# Patient Record
Sex: Male | Born: 1980 | Race: White | Hispanic: No | Marital: Single | Smoking: Current every day smoker
Health system: Southern US, Community
[De-identification: ages and names within clinical notes are randomized; demographics above are authoritative.]

## PROBLEM LIST (undated history)

## (undated) DIAGNOSIS — F32A Depression, unspecified: Secondary | ICD-10-CM

## (undated) DIAGNOSIS — F419 Anxiety disorder, unspecified: Secondary | ICD-10-CM

## (undated) DIAGNOSIS — K759 Inflammatory liver disease, unspecified: Secondary | ICD-10-CM

## (undated) DIAGNOSIS — K219 Gastro-esophageal reflux disease without esophagitis: Secondary | ICD-10-CM

## (undated) DIAGNOSIS — F329 Major depressive disorder, single episode, unspecified: Secondary | ICD-10-CM

## (undated) DIAGNOSIS — F1010 Alcohol abuse, uncomplicated: Secondary | ICD-10-CM

## (undated) DIAGNOSIS — W3400XA Accidental discharge from unspecified firearms or gun, initial encounter: Secondary | ICD-10-CM

## (undated) HISTORY — DX: Anxiety disorder, unspecified: F41.9

## (undated) HISTORY — DX: Inflammatory liver disease, unspecified: K75.9

## (undated) HISTORY — DX: Major depressive disorder, single episode, unspecified: F32.9

## (undated) HISTORY — DX: Depression, unspecified: F32.A

---

## 2001-04-17 ENCOUNTER — Emergency Department (HOSPITAL_COMMUNITY): Admission: EM | Admit: 2001-04-17 | Discharge: 2001-04-17 | Payer: Self-pay | Admitting: *Deleted

## 2002-09-03 ENCOUNTER — Emergency Department (HOSPITAL_COMMUNITY): Admission: EM | Admit: 2002-09-03 | Discharge: 2002-09-03 | Payer: Self-pay | Admitting: Emergency Medicine

## 2002-09-03 ENCOUNTER — Encounter: Payer: Self-pay | Admitting: Emergency Medicine

## 2003-06-25 ENCOUNTER — Emergency Department (HOSPITAL_COMMUNITY): Admission: EM | Admit: 2003-06-25 | Discharge: 2003-06-26 | Payer: Self-pay | Admitting: Emergency Medicine

## 2003-06-26 ENCOUNTER — Encounter: Payer: Self-pay | Admitting: Emergency Medicine

## 2003-06-26 ENCOUNTER — Ambulatory Visit (HOSPITAL_COMMUNITY): Admission: RE | Admit: 2003-06-26 | Discharge: 2003-06-26 | Payer: Self-pay | Admitting: Emergency Medicine

## 2004-11-15 ENCOUNTER — Emergency Department (HOSPITAL_COMMUNITY): Admission: EM | Admit: 2004-11-15 | Discharge: 2004-11-16 | Payer: Self-pay | Admitting: *Deleted

## 2018-04-29 ENCOUNTER — Inpatient Hospital Stay (HOSPITAL_COMMUNITY)
Admission: EM | Admit: 2018-04-29 | Discharge: 2018-05-04 | DRG: 432 | Discharge status: Against Medical Advice | Payer: Self-pay | Attending: Internal Medicine | Admitting: Internal Medicine

## 2018-04-29 DIAGNOSIS — N400 Enlarged prostate without lower urinary tract symptoms: Secondary | ICD-10-CM | POA: Diagnosis present

## 2018-04-29 DIAGNOSIS — K760 Fatty (change of) liver, not elsewhere classified: Secondary | ICD-10-CM | POA: Diagnosis present

## 2018-04-29 DIAGNOSIS — K921 Melena: Secondary | ICD-10-CM | POA: Diagnosis not present

## 2018-04-29 DIAGNOSIS — N50811 Right testicular pain: Secondary | ICD-10-CM

## 2018-04-29 DIAGNOSIS — K625 Hemorrhage of anus and rectum: Secondary | ICD-10-CM

## 2018-04-29 DIAGNOSIS — Z720 Tobacco use: Secondary | ICD-10-CM

## 2018-04-29 DIAGNOSIS — F10230 Alcohol dependence with withdrawal, uncomplicated: Secondary | ICD-10-CM

## 2018-04-29 DIAGNOSIS — Z823 Family history of stroke: Secondary | ICD-10-CM

## 2018-04-29 DIAGNOSIS — F17210 Nicotine dependence, cigarettes, uncomplicated: Secondary | ICD-10-CM | POA: Diagnosis present

## 2018-04-29 DIAGNOSIS — K7010 Alcoholic hepatitis without ascites: Principal | ICD-10-CM | POA: Diagnosis present

## 2018-04-29 DIAGNOSIS — K2920 Alcoholic gastritis without bleeding: Secondary | ICD-10-CM | POA: Diagnosis present

## 2018-04-29 DIAGNOSIS — E43 Unspecified severe protein-calorie malnutrition: Secondary | ICD-10-CM | POA: Diagnosis present

## 2018-04-29 DIAGNOSIS — F1020 Alcohol dependence, uncomplicated: Secondary | ICD-10-CM | POA: Diagnosis present

## 2018-04-29 DIAGNOSIS — Z681 Body mass index (BMI) 19 or less, adult: Secondary | ICD-10-CM

## 2018-04-29 DIAGNOSIS — N433 Hydrocele, unspecified: Secondary | ICD-10-CM | POA: Diagnosis present

## 2018-04-29 DIAGNOSIS — F10239 Alcohol dependence with withdrawal, unspecified: Secondary | ICD-10-CM | POA: Diagnosis present

## 2018-04-29 HISTORY — DX: Accidental discharge from unspecified firearms or gun, initial encounter: W34.00XA

## 2018-04-29 HISTORY — DX: Alcohol abuse, uncomplicated: F10.10

## 2018-04-29 NOTE — ED Triage Notes (Addendum)
Pt with enlarged right testicle x 2 weeks. Pt also c/o pain in lower abdomen and pt thinks he has a hernia (helped leift his dad one day). Pt also c/o bloating. States his stomach always feels full and that he can only take small sips of water at a time before he can drink anymore. Sclera noticeably jaundiced. Pt also c/o chest pain.

## 2018-04-30 ENCOUNTER — Emergency Department (HOSPITAL_COMMUNITY): Payer: Self-pay

## 2018-04-30 ENCOUNTER — Encounter (HOSPITAL_COMMUNITY): Payer: Self-pay | Admitting: Emergency Medicine

## 2018-04-30 ENCOUNTER — Inpatient Hospital Stay (HOSPITAL_COMMUNITY): Payer: Self-pay

## 2018-04-30 ENCOUNTER — Other Ambulatory Visit: Payer: Self-pay

## 2018-04-30 DIAGNOSIS — K7010 Alcoholic hepatitis without ascites: Secondary | ICD-10-CM | POA: Diagnosis present

## 2018-04-30 DIAGNOSIS — F10232 Alcohol dependence with withdrawal with perceptual disturbance: Secondary | ICD-10-CM

## 2018-04-30 DIAGNOSIS — F10239 Alcohol dependence with withdrawal, unspecified: Secondary | ICD-10-CM

## 2018-04-30 DIAGNOSIS — N50811 Right testicular pain: Secondary | ICD-10-CM | POA: Diagnosis present

## 2018-04-30 DIAGNOSIS — F1020 Alcohol dependence, uncomplicated: Secondary | ICD-10-CM | POA: Diagnosis present

## 2018-04-30 LAB — BASIC METABOLIC PANEL
ANION GAP: 12 (ref 5–15)
BUN: <5 mg/dL — ABNORMAL LOW (ref 6–20)
CO2: 27 mmol/L (ref 22–32)
Calcium: 8.8 mg/dL — ABNORMAL LOW (ref 8.9–10.3)
Chloride: 95 mmol/L — ABNORMAL LOW (ref 98–111)
Creatinine, Ser: 0.37 mg/dL — ABNORMAL LOW (ref 0.61–1.24)
GFR calc Af Amer: >60 mL/min (ref >60)
GFR calc non Af Amer: >60 mL/min (ref >60)
GLUCOSE: 100 mg/dL — AB (ref 70–99)
POTASSIUM: 3.7 mmol/L (ref 3.5–5.1)
Sodium: 134 mmol/L — ABNORMAL LOW (ref 135–145)

## 2018-04-30 LAB — HEPATIC FUNCTION PANEL
ALBUMIN: 3.2 g/dL — AB (ref 3.5–5.0)
ALT: 57 U/L — AB (ref 0–44)
AST: 244 U/L — AB (ref 15–41)
Alkaline Phosphatase: 365 U/L — ABNORMAL HIGH (ref 38–126)
BILIRUBIN DIRECT: 2.5 mg/dL — AB (ref 0.0–0.2)
Indirect Bilirubin: 2.5 mg/dL — ABNORMAL HIGH (ref 0.3–0.9)
TOTAL PROTEIN: 7.1 g/dL (ref 6.5–8.1)
Total Bilirubin: 5 mg/dL — ABNORMAL HIGH (ref 0.3–1.2)

## 2018-04-30 LAB — CBC WITH DIFFERENTIAL/PLATELET
BASOS PCT: 1 %
Basophils Absolute: 0.1 10*3/uL (ref 0.0–0.1)
EOS ABS: 0.1 10*3/uL (ref 0.0–0.7)
Eosinophils Relative: 1 %
HCT: 37.9 % — ABNORMAL LOW (ref 39.0–52.0)
Hemoglobin: 13.1 g/dL (ref 13.0–17.0)
LYMPHS ABS: 1.2 10*3/uL (ref 0.7–4.0)
Lymphocytes Relative: 12 %
MCH: 35.5 pg — ABNORMAL HIGH (ref 26.0–34.0)
MCHC: 34.6 g/dL (ref 30.0–36.0)
MCV: 102.7 fL — AB (ref 78.0–100.0)
MONOS PCT: 12 %
Monocytes Absolute: 1.2 10*3/uL — ABNORMAL HIGH (ref 0.1–1.0)
NEUTROS ABS: 7.3 10*3/uL (ref 1.7–7.7)
NEUTROS PCT: 74 %
PLATELETS: 170 10*3/uL (ref 150–400)
RBC: 3.69 MIL/uL — ABNORMAL LOW (ref 4.22–5.81)
RDW: 13 % (ref 11.5–15.5)
WBC: 9.8 10*3/uL (ref 4.0–10.5)

## 2018-04-30 LAB — LACTIC ACID, PLASMA
Lactic Acid, Venous: 1.3 mmol/L (ref 0.5–1.9)
Lactic Acid, Venous: 1.1 mmol/L (ref 0.5–1.9)

## 2018-04-30 LAB — I-STAT CG4 LACTIC ACID, ED: LACTIC ACID, VENOUS: 2.72 mmol/L — AB (ref 0.5–1.9)

## 2018-04-30 LAB — URINALYSIS, ROUTINE W REFLEX MICROSCOPIC
BILIRUBIN URINE: NEGATIVE
GLUCOSE, UA: NEGATIVE mg/dL
HGB URINE DIPSTICK: NEGATIVE
Ketones, ur: NEGATIVE mg/dL
Leukocytes, UA: NEGATIVE
Nitrite: NEGATIVE
PROTEIN: NEGATIVE mg/dL
Specific Gravity, Urine: 1.002 — ABNORMAL LOW (ref 1.005–1.030)
pH: 8 (ref 5.0–8.0)

## 2018-04-30 LAB — PROTIME-INR
INR: 1.18
Prothrombin Time: 14.9 seconds (ref 11.4–15.2)

## 2018-04-30 LAB — ETHANOL: Alcohol, Ethyl (B): 20 mg/dL — ABNORMAL HIGH (ref <10)

## 2018-04-30 LAB — LIPASE, BLOOD: LIPASE: 37 U/L (ref 11–51)

## 2018-04-30 MED ORDER — THIAMINE HCL 100 MG/ML IJ SOLN: 100.0000 mg | Freq: Every day | INTRAMUSCULAR | Status: DC

## 2018-04-30 MED ORDER — LORAZEPAM 2 MG/ML IJ SOLN
0.0000 mg | Freq: Four times a day (QID) | INTRAMUSCULAR | Status: DC
  Administered 2018-04-30: 2 mg via INTRAVENOUS
  Filled 2018-04-30 (×2): qty 1

## 2018-04-30 MED ORDER — ADULT MULTIVITAMIN W/MINERALS CH: 1.0000 | ORAL_TABLET | Freq: Every day | ORAL | Status: DC

## 2018-04-30 MED ORDER — CHLORDIAZEPOXIDE HCL 5 MG PO CAPS
20.0000 mg | ORAL_CAPSULE | Freq: Three times a day (TID) | ORAL | Status: DC
  Administered 2018-04-30 – 2018-05-02 (×7): 20 mg via ORAL
  Filled 2018-04-30 (×6): qty 4

## 2018-04-30 MED ORDER — ADULT MULTIVITAMIN W/MINERALS CH
1.0000 | ORAL_TABLET | Freq: Every day | ORAL | Status: DC
  Administered 2018-05-01 – 2018-05-03 (×3): 1 via ORAL
  Filled 2018-04-30 (×3): qty 1

## 2018-04-30 MED ORDER — LORAZEPAM 2 MG/ML IJ SOLN
2.0000 mg | Freq: Once | INTRAMUSCULAR | Status: AC
  Administered 2018-04-30: 2 mg via INTRAVENOUS
  Filled 2018-04-30: qty 1

## 2018-04-30 MED ORDER — IOPAMIDOL (ISOVUE-300) INJECTION 61%
100.0000 mL | Freq: Once | INTRAVENOUS | Status: AC | PRN
  Administered 2018-04-30: 100 mL via INTRAVENOUS

## 2018-04-30 MED ORDER — FOLIC ACID 1 MG PO TABS: 1.0000 mg | ORAL_TABLET | Freq: Every day | ORAL | Status: DC

## 2018-04-30 MED ORDER — SUCRALFATE 1 GM/10ML PO SUSP
1.0000 g | Freq: Three times a day (TID) | ORAL | Status: DC
  Administered 2018-05-01 – 2018-05-04 (×10): 1 g via ORAL
  Filled 2018-04-30 (×10): qty 10

## 2018-04-30 MED ORDER — SODIUM CHLORIDE 0.9 % IV BOLUS
1000.0000 mL | Freq: Once | INTRAVENOUS | Status: AC
  Administered 2018-04-30: 1000 mL via INTRAVENOUS

## 2018-04-30 MED ORDER — LORAZEPAM 2 MG/ML IJ SOLN
0.0000 mg | Freq: Four times a day (QID) | INTRAMUSCULAR | Status: AC
  Administered 2018-05-01 – 2018-05-02 (×5): 2 mg via INTRAVENOUS
  Administered 2018-05-02: 1 mg via INTRAVENOUS
  Filled 2018-04-30: qty 2
  Filled 2018-04-30 (×5): qty 1

## 2018-04-30 MED ORDER — ONDANSETRON HCL 4 MG/2ML IJ SOLN: 4.0000 mg | Freq: Four times a day (QID) | INTRAMUSCULAR | Status: DC | PRN

## 2018-04-30 MED ORDER — LORAZEPAM 2 MG/ML IJ SOLN
1.0000 mg | Freq: Four times a day (QID) | INTRAMUSCULAR | Status: DC | PRN
  Administered 2018-04-30 (×2): 1 mg via INTRAVENOUS
  Filled 2018-04-30: qty 1

## 2018-04-30 MED ORDER — ENOXAPARIN SODIUM 40 MG/0.4ML ~~LOC~~ SOLN
40.0000 mg | SUBCUTANEOUS | Status: DC
  Administered 2018-04-30 – 2018-05-03 (×4): 40 mg via SUBCUTANEOUS
  Filled 2018-04-30 (×4): qty 0.4

## 2018-04-30 MED ORDER — FENTANYL CITRATE (PF) 100 MCG/2ML IJ SOLN
25.0000 ug | INTRAMUSCULAR | Status: DC | PRN
  Administered 2018-04-30 – 2018-05-03 (×19): 50 ug via INTRAVENOUS
  Filled 2018-04-30 (×19): qty 2

## 2018-04-30 MED ORDER — FOLIC ACID 5 MG/ML IJ SOLN
INTRAMUSCULAR | Status: AC
  Filled 2018-04-30: qty 0.2

## 2018-04-30 MED ORDER — THIAMINE HCL 100 MG/ML IJ SOLN
Freq: Once | INTRAVENOUS | Status: AC
  Administered 2018-04-30: 04:00:00 via INTRAVENOUS
  Filled 2018-04-30: qty 1000

## 2018-04-30 MED ORDER — LORAZEPAM 1 MG PO TABS
1.0000 mg | ORAL_TABLET | Freq: Four times a day (QID) | ORAL | Status: AC | PRN
  Administered 2018-05-03: 1 mg via ORAL
  Filled 2018-04-30: qty 1

## 2018-04-30 MED ORDER — LORAZEPAM 2 MG/ML IJ SOLN: 0.0000 mg | Freq: Two times a day (BID) | INTRAMUSCULAR | Status: DC

## 2018-04-30 MED ORDER — THIAMINE HCL 100 MG/ML IJ SOLN
INTRAMUSCULAR | Status: AC
  Filled 2018-04-30: qty 2

## 2018-04-30 MED ORDER — VITAMIN B-1 100 MG PO TABS: 100.0000 mg | ORAL_TABLET | Freq: Every day | ORAL | Status: DC

## 2018-04-30 MED ORDER — LORAZEPAM 1 MG PO TABS: 1.0000 mg | ORAL_TABLET | Freq: Four times a day (QID) | ORAL | Status: DC | PRN

## 2018-04-30 MED ORDER — PANTOPRAZOLE SODIUM 40 MG PO TBEC
40.0000 mg | DELAYED_RELEASE_TABLET | Freq: Every day | ORAL | Status: DC
  Administered 2018-05-01 – 2018-05-03 (×3): 40 mg via ORAL
  Filled 2018-04-30 (×3): qty 1

## 2018-04-30 MED ORDER — ONDANSETRON HCL 4 MG PO TABS: 4.0000 mg | ORAL_TABLET | Freq: Four times a day (QID) | ORAL | Status: DC | PRN

## 2018-04-30 MED ORDER — LORAZEPAM 2 MG/ML IJ SOLN
0.0000 mg | Freq: Two times a day (BID) | INTRAMUSCULAR | Status: AC
  Administered 2018-05-02 – 2018-05-03 (×3): 1 mg via INTRAVENOUS
  Administered 2018-05-04: 2 mg via INTRAVENOUS
  Filled 2018-04-30 (×4): qty 1

## 2018-04-30 MED ORDER — M.V.I. ADULT IV INJ
INJECTION | INTRAVENOUS | Status: AC
Start: 2018-04-30 — End: ?
  Filled 2018-04-30: qty 10

## 2018-04-30 MED ORDER — FOLIC ACID 1 MG PO TABS
1.0000 mg | ORAL_TABLET | Freq: Every day | ORAL | Status: DC
  Administered 2018-05-01 – 2018-05-03 (×3): 1 mg via ORAL
  Filled 2018-04-30 (×3): qty 1

## 2018-04-30 MED ORDER — FAMOTIDINE IN NACL 20-0.9 MG/50ML-% IV SOLN
20.0000 mg | Freq: Two times a day (BID) | INTRAVENOUS | Status: DC
  Administered 2018-04-30 (×2): 20 mg via INTRAVENOUS
  Filled 2018-04-30 (×2): qty 50

## 2018-04-30 MED ORDER — LORAZEPAM 2 MG/ML IJ SOLN
1.0000 mg | Freq: Four times a day (QID) | INTRAMUSCULAR | Status: AC | PRN
  Administered 2018-04-30 – 2018-05-02 (×4): 1 mg via INTRAVENOUS
  Filled 2018-04-30 (×4): qty 1

## 2018-04-30 MED ORDER — SODIUM CHLORIDE 0.9 % IV SOLN
INTRAVENOUS | Status: DC
Start: 2018-04-30 — End: 2018-05-04
  Administered 2018-04-30 – 2018-05-03 (×5): via INTRAVENOUS

## 2018-04-30 MED ORDER — NICOTINE 21 MG/24HR TD PT24
21.0000 mg | MEDICATED_PATCH | Freq: Every day | TRANSDERMAL | Status: DC
  Administered 2018-04-30 – 2018-05-03 (×4): 21 mg via TRANSDERMAL
  Filled 2018-04-30 (×4): qty 1

## 2018-04-30 MED ORDER — VITAMIN B-1 100 MG PO TABS
100.0000 mg | ORAL_TABLET | Freq: Every day | ORAL | Status: DC
  Administered 2018-05-01 – 2018-05-03 (×3): 100 mg via ORAL
  Filled 2018-04-30 (×3): qty 1

## 2018-04-30 MED ORDER — CHLORDIAZEPOXIDE HCL 5 MG PO CAPS
20.0000 mg | ORAL_CAPSULE | Freq: Three times a day (TID) | ORAL | Status: DC
  Filled 2018-04-30: qty 4

## 2018-04-30 MED ORDER — ENSURE ENLIVE PO LIQD
237.0000 mL | Freq: Two times a day (BID) | ORAL | Status: DC
  Administered 2018-04-30 – 2018-05-02 (×6): 237 mL via ORAL

## 2018-04-30 NOTE — Progress Notes (Signed)
Patient seen and examined; admitted after midnight due to alcoholic hepatitis and withdrawal. Patient's VSS, in no major distress. Expressed some visual hallucinations and would like to use librium instead of scheduled ativan. Please refer to H&P written by Dr. Antionette Charpyd for further info/details on admission.   Plan: -continue CIWA protocol (PRN ativan) -will start librium -continue electrolytes repletion, IVF's and supportive care. -will use PPI and carafate; PRN antiemetics and advance diet as tolerated. -follow LFT's trend   Vassie Lollarlos Nahzir Pohle MD [phone]
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
## 2018-04-30 NOTE — Progress Notes (Signed)
Safety sitter in place to protect patient from elopement. Pt is sleeping Respirations 18 even unlabored
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
## 2018-04-30 NOTE — Clinical Social Work Note (Signed)
LCSW is aware of referral. Patient is currently experiencing alcohol withdrawal. Patient will be assessed for SA needs when he is medically stable.     Fritzi Scripter, Juleen ChinaHeather D, LCSW

## 2018-04-30 NOTE — ED Notes (Signed)
Called AC for med  

## 2018-04-30 NOTE — ED Notes (Signed)
Date and time results received: 04/30/18 12:43 AM  (use smartphrase ".now" to insert current time)  Test:  Lactic Acid, Venous    Component Value Date/Time   LATICACIDVEN 2.72 [facility]) 04/30/2018 0032     Name of Provider Notified: Delo  Orders Received? Or Actions Taken?: See orders

## 2018-05-01 DIAGNOSIS — K219 Gastro-esophageal reflux disease without esophagitis: Secondary | ICD-10-CM

## 2018-05-01 LAB — COMPREHENSIVE METABOLIC PANEL WITH GFR
ALT: 44 U/L (ref 0–44)
AST: 163 U/L — ABNORMAL HIGH (ref 15–41)
Albumin: 2.8 g/dL — ABNORMAL LOW (ref 3.5–5.0)
Alkaline Phosphatase: 296 U/L — ABNORMAL HIGH (ref 38–126)
Anion gap: 10 (ref 5–15)
BUN: <5 mg/dL — ABNORMAL LOW (ref 6–20)
CO2: 26 mmol/L (ref 22–32)
Calcium: 8.7 mg/dL — ABNORMAL LOW (ref 8.9–10.3)
Chloride: 99 mmol/L (ref 98–111)
Creatinine, Ser: 0.33 mg/dL — ABNORMAL LOW (ref 0.61–1.24)
GFR calc Af Amer: >60 mL/min
GFR calc non Af Amer: >60 mL/min
Glucose, Bld: 90 mg/dL (ref 70–99)
Potassium: 3.5 mmol/L (ref 3.5–5.1)
Sodium: 135 mmol/L (ref 135–145)
Total Bilirubin: 5 mg/dL — ABNORMAL HIGH (ref 0.3–1.2)
Total Protein: 6.1 g/dL — ABNORMAL LOW (ref 6.5–8.1)

## 2018-05-01 LAB — HEPATITIS PANEL, ACUTE
HCV Ab: <0.1 s/co ratio (ref 0.0–0.9)
Hep A IgM: NEGATIVE
Hep B C IgM: NEGATIVE
Hepatitis B Surface Ag: NEGATIVE

## 2018-05-01 LAB — HIV ANTIBODY (ROUTINE TESTING W REFLEX): HIV Screen 4th Generation wRfx: NONREACTIVE

## 2018-05-01 NOTE — Progress Notes (Signed)
PROGRESS NOTE    Tom Herman  EAV:[id number] DOB: Feb 10, 1981 DOA: 04/29/2018 PCP: Tom Herman, No Pcp Per     Brief Narrative:  37 y.o. male who denies any significant past medical history, now presenting to the emergency department for evaluation of abdominal pain, nausea with nonbloody vomiting, anorexia, and pain in the right testicle.  Tom Herman reports a long history of excessive alcohol use and states that he has been drinking significantly more over the past 10 months, during which time he has begun drinking hard liquor from the time he wakes up.  For the past few months, he has had progressive loss of appetite with frequent nausea and nonbloody vomiting.  He has had little if any appetite for the past couple weeks and has had epigastric and right upper quadrant abdominal pain.  Others have noted that his eyes have appeared yellow.  He denies history of IV drug use.  Reports that he wants to quit drinking and has attempted to cut back, but these attempts have been complicated by withdrawal symptoms.   Assessment & Plan: 1-Alcoholic hepatitis and alcohol withdrawal -Tom Herman discrimination function score 18, no steroids indicated -will continue CIWA protocol and librium  -will continue thiamine and folic acid  -continue supportive care and follow clinical response -extensive cessation counseling provided; CSW asked to provide outpatient resources. -follow LFT's trend (currently trending down), but bilirubin still up and having ictericia, will check RUQ US -follow electrolytes and replete them as needed   2-gastritis  -most likely alcohol induced -will continue PPI and carafate   3-Right testicular pain -no abnormalities seen on scrotum US -Tom Herman denies pain during my evaluation   4-tobacco abuse -nicotine patch ordered -I have discussed tobacco cessation with the Tom Herman.  I have counseled the Tom Herman regarding the negative impacts of continued tobacco use including but not  limited to lung cancer, COPD, and cardiovascular disease.  I have discussed alternatives to tobacco and modalities that may help facilitate tobacco cessation including but not limited to biofeedback, hypnosis, and medications.  Total time spent with tobacco counseling was 5 minutes.   DVT prophylaxis: Lovenox Code Status: Full code Family Communication: No family at bedside (girlfriend was updated on 04/30/2018). Disposition Plan: Continue current medication to assist with alcohol withdrawal, continue advancing diet as tolerated, follow liver function test and check RUQ US.  Consultants:   None  Procedures:   See below for x-ray reports.  Antimicrobials:  Anti-infectives (From admission, onward)   None      Subjective: Afebrile, no chest pain, no shortness of breath.  Tom Herman still reporting decreased appetite and complaining of burning midepigastric area and right upper quadrant.  Significant events associated with his alcohol withdrawal overnight (including hallucination, anxiety and almost leaving AMA).  Objective: Vitals:   04/30/18 1413 04/30/18 2200 05/01/18 0408 05/01/18 1024  BP: (!) 162/112 (!) 141/103 (!) 138/97 (!) 131/95  Pulse: (!) 174 100 88 91  Resp: 18  16 20   Temp: 99.4 F (37.4 C) 98.1 F (36.7 C) 98.6 F (37 C)   TempSrc: Oral Oral Oral   SpO2: 100% 100% 98% 98%  Weight:      Height:        Intake/Output Summary (Last 24 hours) at 05/01/2018 1653 Last data filed at 05/01/2018 0600 Gross per 24 hour  Intake 585 ml  Output -  Net 585 ml   Filed Weights   04/30/18 0000 04/30/18 0334  Weight: 54.4 kg 58.9 kg    Examination: General exam:  Alert, awake, oriented x 2; slightly anxious and tremulous on exam.  Denies chest pain, no shortness of breath. Positive icterus (even slightly improved). Still reporting epigastric and right upper quadrant discomfort. Respiratory system: Clear to auscultation. Respiratory effort normal. Cardiovascular system:  Tachycardic, no rubs, no gallops, no murmurs, no JVD.   Gastrointestinal system: Abdomen is nondistended, soft without rebound's.  Tom Herman tender to palpation in his right upper quadrant midepigastric area.  Positive bowel sounds.   Central nervous system: Cranial nerve grossly intact, no focal motor deficits on exam.  Able to follow commands without difficulties and answer questions. Extremities: No C/C/E, +pedal pulses Skin: No rashes, lesions or ulcers Psychiatry: Judgement and insight appear impaired due to ongoing alcohol withdrawal.   Data Reviewed: I have personally reviewed following labs and imaging studies  CBC: Recent Labs  Lab 04/30/18 0024  WBC 9.8  NEUTROABS 7.3  HGB 13.1  HCT 37.9*  MCV 102.7*  PLT 170   Basic Metabolic Panel: Recent Labs  Lab 04/30/18 0024 05/01/18 0501  NA 134* 135  K 3.7 3.5  CL 95* 99  CO2 27 26  GLUCOSE 100* 90  BUN <5* <5*  CREATININE 0.37* 0.33*  CALCIUM 8.8* 8.7*   GFR: Estimated Creatinine Clearance: 107.4 mL/min (A) (by C-G formula based on SCr of 0.33 mg/dL (L)).   Liver Function Tests: Recent Labs  Lab 04/30/18 0024 05/01/18 0501  AST 244* 163*  ALT 57* 44  ALKPHOS 365* 296*  BILITOT 5.0* 5.0*  PROT 7.1 6.1*  ALBUMIN 3.2* 2.8*   Recent Labs  Lab 04/30/18 0024  LIPASE 37   Coagulation Profile: Recent Labs  Lab 04/30/18 0024  INR 1.18   Urine analysis:    Component Value Date/Time   COLORURINE YELLOW 04/30/2018 0033   APPEARANCEUR CLEAR 04/30/2018 0033   LABSPEC 1.002 (L) 04/30/2018 0033   PHURINE 8.0 04/30/2018 0033   GLUCOSEU NEGATIVE 04/30/2018 0033   HGBUR NEGATIVE 04/30/2018 0033   BILIRUBINUR NEGATIVE 04/30/2018 0033   KETONESUR NEGATIVE 04/30/2018 0033   PROTEINUR NEGATIVE 04/30/2018 0033   NITRITE NEGATIVE 04/30/2018 0033   LEUKOCYTESUR NEGATIVE 04/30/2018 0033   Radiology Studies: [place] Scrotum  Result Date: 04/30/2018 CLINICAL DATA:  37 year old male with RIGHT testicular pain for 2 weeks.  EXAM: SCROTAL ULTRASOUND DOPPLER ULTRASOUND OF THE TESTICLES TECHNIQUE: Complete ultrasound examination of the testicles, epididymis, and other scrotal structures was performed. Color and spectral Doppler ultrasound were also utilized to evaluate blood flow to the testicles. COMPARISON:  None. FINDINGS: Right testicle Measurements: 3.8 x 2.1 x 2.4 cm. No mass or microlithiasis visualized. Left testicle Measurements: 4.2 x 1.8 x 2.8 cm. No mass or microlithiasis visualized. Right epididymis:  Normal in size and appearance. Left epididymis:  Normal in size and appearance. Hydrocele:  Very small bilateral hydroceles noted. Varicocele:  None visualized. Pulsed Doppler interrogation of both testes demonstrates normal low resistance arterial and venous waveforms bilaterally. Scrotal calcifications are chronic, compatible with prior infection or inflammation. IMPRESSION: 1. Normal testicles bilaterally. No evidence of testicular torsion or mass. 2. Very small bilateral hydroceles. Electronically Signed   By: Harmon PierJeffrey  Hu M.D.   On: 04/30/2018 10:28   Ct Abdomen Pelvis W Contrast  Result Date: 04/30/2018 CLINICAL DATA:  Epigastric and low pelvic pain. Enlarged right testicle. Bloating. Jaundice for 2 weeks. Right testicular pain beginning 2 weeks ago while running. EXAM: CT ABDOMEN AND PELVIS WITH CONTRAST TECHNIQUE: Multidetector CT imaging of the abdomen and pelvis was performed using the standard protocol following  bolus administration of intravenous contrast. CONTRAST:  ISOVUE-300 IOPAMIDOL (ISOVUE-300) INJECTION 61% COMPARISON:  None. FINDINGS: Lower chest: The lung bases are clear. Hepatobiliary: Prominent diffuse fatty infiltration of the liver. No focal liver lesions identified. Gallbladder is somewhat distended but there is no wall thickening, inflammatory infiltration, or stones identified. No bile duct dilatation. Pancreas: Unremarkable. No pancreatic ductal dilatation or surrounding inflammatory  changes. Spleen: Normal in size without focal abnormality. Adrenals/Urinary Tract: No adrenal gland nodules. Kidneys are symmetrical. Nephrograms are homogeneous. No hydronephrosis or hydroureter. Bladder wall is mildly thickened, possibly indicating cystitis. No filling defects or stones identified. Stomach/Bowel: Stomach is within normal limits. Appendix is not identified. No evidence of bowel wall thickening, distention, or inflammatory changes. Vascular/Lymphatic: No significant vascular findings are present. No enlarged abdominal or pelvic lymph nodes. Reproductive: Prostate gland is enlarged, measuring 4.3 cm diameter. Prostate calcifications are present. Other: No abdominal wall hernia or abnormality. No abdominopelvic ascites. Musculoskeletal: No acute or significant osseous findings. IMPRESSION: 1. Prominent diffuse fatty infiltration of the liver. No focal liver lesions identified. 2. Bladder wall thickening, possibly indicating cystitis. 3. No evidence of bowel obstruction or inflammation. 4. Enlarged prostate gland. Electronically Signed   By: Burman Nieves M.D.   On: 04/30/2018 01:42   US Pelvic Doppler (torsion R/o Or Mass Arterial Flow)  Result Date: 04/30/2018 CLINICAL DATA:  37 year old male with RIGHT testicular pain for 2 weeks. EXAM: SCROTAL ULTRASOUND DOPPLER ULTRASOUND OF THE TESTICLES TECHNIQUE: Complete ultrasound examination of the testicles, epididymis, and other scrotal structures was performed. Color and spectral Doppler ultrasound were also utilized to evaluate blood flow to the testicles. COMPARISON:  None. FINDINGS: Right testicle Measurements: 3.8 x 2.1 x 2.4 cm. No mass or microlithiasis visualized. Left testicle Measurements: 4.2 x 1.8 x 2.8 cm. No mass or microlithiasis visualized. Right epididymis:  Normal in size and appearance. Left epididymis:  Normal in size and appearance. Hydrocele:  Very small bilateral hydroceles noted. Varicocele:  None visualized. Pulsed Doppler  interrogation of both testes demonstrates normal low resistance arterial and venous waveforms bilaterally. Scrotal calcifications are chronic, compatible with prior infection or inflammation. IMPRESSION: 1. Normal testicles bilaterally. No evidence of testicular torsion or mass. 2. Very small bilateral hydroceles. Electronically Signed   By: Harmon Pier M.D.   On: 04/30/2018 10:28    Scheduled Meds: . chlordiazePOXIDE  20 mg Oral TID  . enoxaparin (LOVENOX) injection  40 mg Subcutaneous Q24H  . feeding supplement (ENSURE ENLIVE)  237 mL Oral BID BM  . folic acid  1 mg Oral Daily  . LORazepam  0-4 mg Intravenous Q6H   Followed by  . [START ON 05/02/2018] LORazepam  0-4 mg Intravenous Q12H  . multivitamin with minerals  1 tablet Oral Daily  . nicotine  21 mg Transdermal Daily  . pantoprazole  40 mg Oral Daily  . sucralfate  1 g Oral TID WC  . thiamine  100 mg Oral Daily   Or  . thiamine  100 mg Intravenous Daily   Continuous Infusions: . sodium chloride 75 mL/hr at 05/01/18 1101     LOS: 1 day    Time spent: 30 minutes.   Vassie Loll, MD [facility] Pager [phone]  If 7PM-7AM, please contact night-coverage [email or web address] Password [facility] 05/01/2018, 4:53 PM
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
## 2018-05-01 NOTE — Progress Notes (Signed)
Pt up and agitated. He states we are not helping him and that he is our "[place]". He stated he was going to leave regardless or what the doctor said. I informed him that he needed to sign AMA documentation and his IV needed to be removed. He asked me to remove it but I said he needed to call someone to come pick him up and then we can take the IV out. He called his girlfriend and she told him to stay because he was not well. She told him she would be back in the morning and hung up the phone. He became angry and threw himself on the bed. He did not end up leaving AMA.

## 2018-05-02 ENCOUNTER — Inpatient Hospital Stay (HOSPITAL_COMMUNITY): Payer: Self-pay

## 2018-05-02 DIAGNOSIS — E43 Unspecified severe protein-calorie malnutrition: Secondary | ICD-10-CM

## 2018-05-02 DIAGNOSIS — F10230 Alcohol dependence with withdrawal, uncomplicated: Secondary | ICD-10-CM

## 2018-05-02 DIAGNOSIS — Z720 Tobacco use: Secondary | ICD-10-CM

## 2018-05-02 MED ORDER — CHLORDIAZEPOXIDE HCL 5 MG PO CAPS
5.0000 mg | ORAL_CAPSULE | Freq: Three times a day (TID) | ORAL | Status: AC
  Administered 2018-05-02 – 2018-05-03 (×4): 5 mg via ORAL
  Filled 2018-05-02 (×4): qty 1

## 2018-05-02 NOTE — Progress Notes (Signed)
PROGRESS NOTE  Tom Herman [id number] DOB: 1980-10-07 DOA: 04/29/2018 PCP: Patient, No Pcp Per  Brief History:  37 y.o. male who denies any significant past medical history, now presenting to the emergency department for evaluation of abdominal pain, nausea with nonbloody vomiting, anorexia, and pain in the right testicle.  Patient reports a long history of excessive alcohol use and states that he has been drinking significantly more over the past 10 months, during which time he has begun drinking hard liquor from the time he wakes up.  For the past few months, he has had progressive loss of appetite with frequent nausea and nonbloody vomiting.  He has had little if any appetite for the past couple weeks and has had epigastric and right upper quadrant abdominal pain.  Others have noted that his eyes have appeared yellow.  He denies history of IV drug use.  Reports that he wants to quit drinking and has attempted to cut back, but these attempts have been complicated by withdrawal symptoms.   Assessment/Plan: Intractable vomiting -Secondary to alcoholic gastritis -04/30/2018 CT abdomen/pelvis--fatty liver, bowel wall thickening, enlarged prostate; no bowel wall thickening -Saline lock IV fluids as vomiting is improving -Continue PPI  Alcohol withdrawal -Decrease Librium -Continue CIWA scale  Alcoholic hepatitis -LFTs trending down -Viral hepatitis serologies negative -HIV negative  Testicular pain -Scrotal ultrasound negative for acute findings except for very small bilateral hydroceles -Improving  Tobacco abuse I have discussed tobacco cessation with the patient.  I have counseled the patient regarding the negative impacts of continued tobacco use including but not limited to lung cancer, COPD, and cardiovascular disease.  I have discussed alternatives to tobacco and modalities that may help facilitate tobacco cessation including but not limited to biofeedback,  hypnosis, and medications.  Total time spent with tobacco counseling was 4 minutes.  Severe protein calorie malnutrition -Continue supplements  Hematochezia -Consult GI -A.m. CBC    Disposition Plan:   Home in 1-2 days  Family Communication:   Family at bedside  Consultants:  GI  Code Status:  FULL  DVT Prophylaxis:  SCDs   Procedures: As Listed in Progress Note Above  Antibiotics: None    Subjective: Patient states that vomiting is improved.  He still having some visual hallucinations but it is improving.  He still having some tremors.  This is improving.  He denies any headache, chest pain, shortness breath, hematemesis, coffee-ground emesis.  He has had some hematochezia.  Abdominal pain is improving.  Objective: Vitals:   05/01/18 2058 05/02/18 0424 05/02/18 1407 05/02/18 1518  BP: (!) 136/96 (!) 141/98 (!) 131/100   Pulse: 100 (!) 101 (!) 108   Resp: 16 16 16    Temp: 98.3 F (36.8 C) 98.6 F (37 C) 98.8 F (37.1 C)   TempSrc: Oral Oral Oral   SpO2: 99% 99% 100% 98%  Weight:      Height:        Intake/Output Summary (Last 24 hours) at 05/02/2018 1746 Last data filed at 05/02/2018 1100 Gross per 24 hour  Intake 480 ml  Output -  Net 480 ml   Weight change:  Exam:   General:  Pt is alert, follows commands appropriately, not in acute distress  HEENT: No icterus, No thrush, No neck mass, [place]/AT  Cardiovascular: RRR, S1/S2, no rubs, no gallops  Respiratory: CTA bilaterally, no wheezing, no crackles, no rhonchi  Abdomen: Soft/+BS, epigastric tender, non distended, no guarding  Extremities:  No edema, No lymphangitis, No petechiae, No rashes, no synovitis   Data Reviewed: I have personally reviewed following labs and imaging studies Basic Metabolic Panel: Recent Labs  Lab 04/30/18 0024 05/01/18 0501  NA 134* 135  K 3.7 3.5  CL 95* 99  CO2 27 26  GLUCOSE 100* 90  BUN <5* <5*  CREATININE 0.37* 0.33*  CALCIUM 8.8* 8.7*   Liver Function  Tests: Recent Labs  Lab 04/30/18 0024 05/01/18 0501  AST 244* 163*  ALT 57* 44  ALKPHOS 365* 296*  BILITOT 5.0* 5.0*  PROT 7.1 6.1*  ALBUMIN 3.2* 2.8*   Recent Labs  Lab 04/30/18 0024  LIPASE 37   No results for input(s): AMMONIA in the last 168 hours. Coagulation Profile: Recent Labs  Lab 04/30/18 0024  INR 1.18   CBC: Recent Labs  Lab 04/30/18 0024  WBC 9.8  NEUTROABS 7.3  HGB 13.1  HCT 37.9*  MCV 102.7*  PLT 170   Cardiac Enzymes: No results for input(s): CKTOTAL, CKMB, CKMBINDEX, TROPONINI in the last 168 hours. BNP: Invalid input(s): POCBNP CBG: No results for input(s): GLUCAP in the last 168 hours. HbA1C: No results for input(s): HGBA1C in the last 72 hours. Urine analysis:    Component Value Date/Time   COLORURINE YELLOW 04/30/2018 0033   APPEARANCEUR CLEAR 04/30/2018 0033   LABSPEC 1.002 (L) 04/30/2018 0033   PHURINE 8.0 04/30/2018 0033   GLUCOSEU NEGATIVE 04/30/2018 0033   HGBUR NEGATIVE 04/30/2018 0033   BILIRUBINUR NEGATIVE 04/30/2018 0033   KETONESUR NEGATIVE 04/30/2018 0033   PROTEINUR NEGATIVE 04/30/2018 0033   NITRITE NEGATIVE 04/30/2018 0033   LEUKOCYTESUR NEGATIVE 04/30/2018 0033   Sepsis Labs: @LABRCNTIP (procalcitonin:4,lacticidven:4) )No results found for this or any previous visit (from the past 240 hour(s)).   Scheduled Meds: . chlordiazePOXIDE  5 mg Oral TID  . enoxaparin (LOVENOX) injection  40 mg Subcutaneous Q24H  . feeding supplement (ENSURE ENLIVE)  237 mL Oral BID BM  . folic acid  1 mg Oral Daily  . LORazepam  0-4 mg Intravenous Q12H  . multivitamin with minerals  1 tablet Oral Daily  . nicotine  21 mg Transdermal Daily  . pantoprazole  40 mg Oral Daily  . sucralfate  1 g Oral TID WC  . thiamine  100 mg Oral Daily   Or  . thiamine  100 mg Intravenous Daily   Continuous Infusions: . sodium chloride 75 mL/hr at 05/01/18 2312    Procedures/Studies: US Scrotum  Result Date: 04/30/2018 CLINICAL DATA:   37 year old male with RIGHT testicular pain for 2 weeks. EXAM: SCROTAL ULTRASOUND DOPPLER ULTRASOUND OF THE TESTICLES TECHNIQUE: Complete ultrasound examination of the testicles, epididymis, and other scrotal structures was performed. Color and spectral Doppler ultrasound were also utilized to evaluate blood flow to the testicles. COMPARISON:  None. FINDINGS: Right testicle Measurements: 3.8 x 2.1 x 2.4 cm. No mass or microlithiasis visualized. Left testicle Measurements: 4.2 x 1.8 x 2.8 cm. No mass or microlithiasis visualized. Right epididymis:  Normal in size and appearance. Left epididymis:  Normal in size and appearance. Hydrocele:  Very small bilateral hydroceles noted. Varicocele:  None visualized. Pulsed Doppler interrogation of both testes demonstrates normal low resistance arterial and venous waveforms bilaterally. Scrotal calcifications are chronic, compatible with prior infection or inflammation. IMPRESSION: 1. Normal testicles bilaterally. No evidence of testicular torsion or mass. 2. Very small bilateral hydroceles. Electronically Signed   By: Harmon Pier M.D.   On: 04/30/2018 10:28   Ct Abdomen Pelvis W Contrast  Result Date: 04/30/2018 CLINICAL DATA:  Epigastric and low pelvic pain. Enlarged right testicle. Bloating. Jaundice for 2 weeks. Right testicular pain beginning 2 weeks ago while running. EXAM: CT ABDOMEN AND PELVIS WITH CONTRAST TECHNIQUE: Multidetector CT imaging of the abdomen and pelvis was performed using the standard protocol following bolus administration of intravenous contrast. CONTRAST:  100mL ISOVUE-300 IOPAMIDOL (ISOVUE-300) INJECTION 61% COMPARISON:  None. FINDINGS: Lower chest: The lung bases are clear. Hepatobiliary: Prominent diffuse fatty infiltration of the liver. No focal liver lesions identified. Gallbladder is somewhat distended but there is no wall thickening, inflammatory infiltration, or stones identified. No bile duct dilatation. Pancreas: Unremarkable. No  pancreatic ductal dilatation or surrounding inflammatory changes. Spleen: Normal in size without focal abnormality. Adrenals/Urinary Tract: No adrenal gland nodules. Kidneys are symmetrical. Nephrograms are homogeneous. No hydronephrosis or hydroureter. Bladder wall is mildly thickened, possibly indicating cystitis. No filling defects or stones identified. Stomach/Bowel: Stomach is within normal limits. Appendix is not identified. No evidence of bowel wall thickening, distention, or inflammatory changes. Vascular/Lymphatic: No significant vascular findings are present. No enlarged abdominal or pelvic lymph nodes. Reproductive: Prostate gland is enlarged, measuring 4.3 cm diameter. Prostate calcifications are present. Other: No abdominal wall hernia or abnormality. No abdominopelvic ascites. Musculoskeletal: No acute or significant osseous findings. IMPRESSION: 1. Prominent diffuse fatty infiltration of the liver. No focal liver lesions identified. 2. Bladder wall thickening, possibly indicating cystitis. 3. No evidence of bowel obstruction or inflammation. 4. Enlarged prostate gland. Electronically Signed   By: Burman NievesWilliam  Stevens M.D.   On: 04/30/2018 01:42   [place] Pelvic Doppler (torsion R/o Or Mass Arterial Flow)  Result Date: 04/30/2018 CLINICAL DATA:  37 year old male with RIGHT testicular pain for 2 weeks. EXAM: SCROTAL ULTRASOUND DOPPLER ULTRASOUND OF THE TESTICLES TECHNIQUE: Complete ultrasound examination of the testicles, epididymis, and other scrotal structures was performed. Color and spectral Doppler ultrasound were also utilized to evaluate blood flow to the testicles. COMPARISON:  None. FINDINGS: Right testicle Measurements: 3.8 x 2.1 x 2.4 cm. No mass or microlithiasis visualized. Left testicle Measurements: 4.2 x 1.8 x 2.8 cm. No mass or microlithiasis visualized. Right epididymis:  Normal in size and appearance. Left epididymis:  Normal in size and appearance. Hydrocele:  Very small bilateral  hydroceles noted. Varicocele:  None visualized. Pulsed Doppler interrogation of both testes demonstrates normal low resistance arterial and venous waveforms bilaterally. Scrotal calcifications are chronic, compatible with prior infection or inflammation. IMPRESSION: 1. Normal testicles bilaterally. No evidence of testicular torsion or mass. 2. Very small bilateral hydroceles. Electronically Signed   By: Harmon PierJeffrey  Hu M.D.   On: 04/30/2018 10:28   [place] Abdomen Limited Ruq  Result Date: 05/02/2018 CLINICAL DATA:  Bilirubinemia EXAM: ULTRASOUND ABDOMEN LIMITED RIGHT UPPER QUADRANT COMPARISON:  04/30/2018 FINDINGS: Gallbladder: No gallstones or wall thickening visualized. No sonographic Murphy sign noted by sonographer. Common bile duct: Diameter: Normal caliber, 2 mm Liver: Increased echotexture compatible with fatty infiltration. No focal abnormality or biliary ductal dilatation. Liver appears prominent, possible hepatomegaly. Portal vein is patent on color Doppler imaging with normal direction of blood flow towards the liver. IMPRESSION: No evidence of cholelithiasis or cholecystitis. Fatty infiltration of the liver, likely hepatomegaly. Electronically Signed   By: Charlett NoseKevin  Dover M.D.   On: 05/02/2018 10:14    Catarina Hartshornavid Alice Vitelli, DO  [facility] Pager [phone]  If 7PM-7AM, please contact night-coverage [email or web address] Password [email or web address] 05/02/2018, 5:46 PM   LOS: 2 days

## 2018-05-03 ENCOUNTER — Encounter (HOSPITAL_COMMUNITY): Payer: Self-pay | Admitting: Gastroenterology

## 2018-05-03 DIAGNOSIS — F10230 Alcohol dependence with withdrawal, uncomplicated: Secondary | ICD-10-CM

## 2018-05-03 DIAGNOSIS — E43 Unspecified severe protein-calorie malnutrition: Secondary | ICD-10-CM

## 2018-05-03 DIAGNOSIS — Z720 Tobacco use: Secondary | ICD-10-CM

## 2018-05-03 DIAGNOSIS — K625 Hemorrhage of anus and rectum: Secondary | ICD-10-CM

## 2018-05-03 DIAGNOSIS — K7010 Alcoholic hepatitis without ascites: Principal | ICD-10-CM

## 2018-05-03 LAB — COMPREHENSIVE METABOLIC PANEL
ALT: 51 U/L — ABNORMAL HIGH (ref 0–44)
ANION GAP: 8 (ref 5–15)
AST: 139 U/L — AB (ref 15–41)
Albumin: 2.8 g/dL — ABNORMAL LOW (ref 3.5–5.0)
Alkaline Phosphatase: 274 U/L — ABNORMAL HIGH (ref 38–126)
BILIRUBIN TOTAL: 4.5 mg/dL — AB (ref 0.3–1.2)
BUN: 6 mg/dL (ref 6–20)
CHLORIDE: 103 mmol/L (ref 98–111)
CO2: 25 mmol/L (ref 22–32)
Calcium: 8.5 mg/dL — ABNORMAL LOW (ref 8.9–10.3)
Creatinine, Ser: 0.47 mg/dL — ABNORMAL LOW (ref 0.61–1.24)
GFR calc Af Amer: >60 mL/min (ref >60)
Glucose, Bld: 93 mg/dL (ref 70–99)
POTASSIUM: 3.4 mmol/L — AB (ref 3.5–5.1)
Sodium: 136 mmol/L (ref 135–145)
TOTAL PROTEIN: 6 g/dL — AB (ref 6.5–8.1)

## 2018-05-03 LAB — MAGNESIUM: MAGNESIUM: 1.4 mg/dL — AB (ref 1.7–2.4)

## 2018-05-03 LAB — CBC
HEMATOCRIT: 35.2 % — AB (ref 39.0–52.0)
HEMOGLOBIN: 11.8 g/dL — AB (ref 13.0–17.0)
MCH: 35 pg — ABNORMAL HIGH (ref 26.0–34.0)
MCHC: 33.5 g/dL (ref 30.0–36.0)
MCV: 104.5 fL — AB (ref 78.0–100.0)
Platelets: 177 10*3/uL (ref 150–400)
RBC: 3.37 MIL/uL — AB (ref 4.22–5.81)
RDW: 13.3 % (ref 11.5–15.5)
WBC: 8.1 10*3/uL (ref 4.0–10.5)

## 2018-05-03 LAB — PROTIME-INR
INR: 1.13
Prothrombin Time: 14.4 seconds (ref 11.4–15.2)

## 2018-05-03 MED ORDER — SILVER NITRATE-POT NITRATE 75-25 % EX MISC
1.0000 | Freq: Once | CUTANEOUS | Status: AC
  Administered 2018-05-03: 1 via TOPICAL
  Filled 2018-05-03: qty 1

## 2018-05-03 MED ORDER — POTASSIUM CHLORIDE CRYS ER 20 MEQ PO TBCR
40.0000 meq | EXTENDED_RELEASE_TABLET | Freq: Once | ORAL | Status: AC
Start: 2018-05-03 — End: 2018-05-03
  Administered 2018-05-03: 40 meq via ORAL
  Filled 2018-05-03: qty 2

## 2018-05-03 NOTE — Clinical Social Work Note (Addendum)
Late Entry for 05/02/18  Clinical Social Work Assessment  Patient Details  Name: Tom Herman MRN: [id number] Date of Birth: Jan 10, 1981  Date of referral:  05/02/18               Reason for consult:  Substance Use/ETOH Abuse                Permission sought to share information with:    Permission granted to share information::     Name::        Agency::     Relationship::     Contact Information:     Housing/Transportation Living arrangements for the past 2 months:  Apartment Source of Information:  Patient Patient Interpreter Needed:  None Criminal Activity/Legal Involvement Pertinent to Current Situation/Hospitalization:  No - Comment as needed Significant Relationships:  Significant Other, Parents Lives with:  Parents Do you feel safe going back to the place where you live?  Yes Need for family participation in patient care:  Yes (Comment)  Care giving concerns:  None   Social Worker assessment / plan:  Patient reports a history of alcohol abuse and treatment programs. He states that most recently, he completed [facility] use. Patient was able to identify triggers of his SA. Patient verbalized agreement with SA treatment at [facility] and LCSW contacted facility to schedule appointment.  Patient would not commit to an appointment date indicating that he would have to see what his girlfriend's work schedule was before he made an appointment.   SBIRT was completed with patient.  LCSW provided patient with the number to [facility] to schedule his appointment that met his scheduling. Alcohol resources including AA meetings were provided to patient.  LCSW signing off.    Employment status:  Unemployed Forensic scientist:  Self Pay (Medicaid Pending) PT Recommendations:  Not assessed at this time Information / Referral to community resources:  Outpatient Substance Abuse Treatment Options  Patient/Family's Response to care:  Patient states that he is agreeable to  alcohol treatment on an outpatient basis.   Patient/Family's Understanding of and Emotional Response to Diagnosis, Current Treatment, and Prognosis:  Patient understands his diagnosis, treatment and prognosis.   Emotional Assessment Appearance:  Appears older than stated age Attitude/Demeanor/Rapport:  Complaining Affect (typically observed):  Agitated Orientation:  Oriented to Self, Oriented to Place, Oriented to  Time, Oriented to Situation Alcohol / Substance use:  Alcohol Use Psych involvement (Current and /or in the community):  No (Comment)  Discharge Needs  Concerns to be addressed:  Discharge Planning Concerns Readmission within the last 30 days:  No Current discharge risk:  None Barriers to Discharge:  Active Substance Use   Ihor Gully, LCSW 05/03/2018, 9:56 AM

## 2018-05-03 NOTE — Discharge Summary (Signed)
Physician Discharge Summary  Tom Herman Poss ZOX:[id number] DOB: June 09, 1981 DOA: 04/29/2018  PCP: Patient, No Pcp Per  Admit date: 04/29/2018 Discharge date: 2018-05-24  Admitted From: Home Disposition:  AGAINST MEDICAL ADVICE   Discharge Condition: AGAINST MEDICAL ADVICE CODE STATUS: full Diet recommendation: Heart Healthy / Carb Modified   Brief/Interim Summary: 36 y.o.malewho denies any significant past medical history, now presenting to the emergency department for evaluation of abdominal pain, nausea with nonbloody vomiting, anorexia, and pain in the right testicle. Patient reports a long history of excessive alcohol use and states that he has been drinking significantly more over the past 10 months, during which time he has begun drinking hard liquor from the time he wakes up. For the past few months, he has had progressive loss of appetite with frequent nausea and nonbloody vomiting. He has had little if any appetite for the past couple weeks and has had epigastric and right upper quadrant abdominal pain. Others have noted that his eyes have appeared yellow. He denies history of IV drug use. Reports that he wants to quit drinking and has attempted to cut back, but these attempts have been complicated by withdrawal symptoms.  The patient was admitted and placed on alcohol withdrawal protocol and he gradually improved.  He was initially placed on librium.  This was gradually weaned without any worsening of his withdrawal symptoms.  However, on 24-May-2018 afternoon, the patient was angered that he would not be allowed to smoke despite already having a nicoderm patch.  Therefore, the patient wanted to leave against medical advice.  His CIWA score was <8 for 24 hour as his librium has been weaned.  He did not want to stay for the remainder of the weaning.    In speaking with Gilford Silvius, he demonstrated the ability to understand the serious nature and consequences of leaving the  hospital prior to completion of an adequate course of treatment. He was able to articulate the consequences of leaving prior to completion of adequate therapy, and he understood that leaving could result in bodily harm or even death.    In the afternoon of 05-24-18, I was informed that the patient wished to leave the hospital against medical advice. Tom Herman stated that he wished to leave the hospital prior to completing medical therapy because he felt much better, was able to eat and drink and was uncomfortable and did not want to stay. We dicussed with him the nature of his present illness, that he was being kept in the hospital due to ongoing active issues which included but not limited to: Intermittent difficulties tolerating p.o, alcohol withdrawal and hematochezia.  It was explained that inpatient therapy was still warranted because of the risk of recurrence withdrawal symptoms and seizure and that the risks of leaving prior to completion of treatment and preparation of a safe discharge plan would be recurrence of alcohol withdrawal and bleeding possibly leading to death,  leading to endorgan damage. Ultimately, Tom Herman chose to forgo further treatment in the inpatient setting.   The patient was A&Ox4 with CIWA <8 x 24 hours.  Discharge Diagnoses:  Intractable vomiting -Secondary to alcoholic gastritis -04/30/2018 CT abdomen/pelvis--fatty liver, bowel wall thickening, enlarged prostate; no bowel wall thickening -Saline lock IV fluids as vomiting is improving -Continue PPI -tolerating soft diet  Alcohol withdrawal -Decrease Librium--continue to wean off -Continue CIWA scale  Alcoholic hepatitis -LFTs trending down -Viral hepatitis serologies negative -HIV negative  Testicular pain -Scrotal ultrasound negative for acute findings except  for very small bilateral hydroceles -Improving  Tobacco abuse I have discussed tobacco cessation with the patient.  I have  counseled the patient regarding the negative impacts of continued tobacco use including but not limited to lung cancer, COPD, and cardiovascular disease.  I have discussed alternatives to tobacco and modalities that may help facilitate tobacco cessation including but not limited to biofeedback, hypnosis, and medications.  Total time spent with tobacco counseling was 4 minutes.  Severe protein calorie malnutrition -Continue supplements  Hematochezia -Consult GI appreciated -pt offered colonoscopy, but he declined further work up at this time -Hgb stable   Discharge Instructions   Allergies as of 05/03/2018   No Known Allergies     Medication List    TAKE these medications   multivitamin with minerals Tabs tablet Take 1 tablet by mouth daily.       No Known Allergies  Consultations:  none   Procedures/Studies: US Scrotum  Result Date: 04/30/2018 CLINICAL DATA:  37 year old male with RIGHT testicular pain for 2 weeks. EXAM: SCROTAL ULTRASOUND DOPPLER ULTRASOUND OF THE TESTICLES TECHNIQUE: Complete ultrasound examination of the testicles, epididymis, and other scrotal structures was performed. Color and spectral Doppler ultrasound were also utilized to evaluate blood flow to the testicles. COMPARISON:  None. FINDINGS: Right testicle Measurements: 3.8 x 2.1 x 2.4 cm. No mass or microlithiasis visualized. Left testicle Measurements: 4.2 x 1.8 x 2.8 cm. No mass or microlithiasis visualized. Right epididymis:  Normal in size and appearance. Left epididymis:  Normal in size and appearance. Hydrocele:  Very small bilateral hydroceles noted. Varicocele:  None visualized. Pulsed Doppler interrogation of both testes demonstrates normal low resistance arterial and venous waveforms bilaterally. Scrotal calcifications are chronic, compatible with prior infection or inflammation. IMPRESSION: 1. Normal testicles bilaterally. No evidence of testicular torsion or mass. 2. Very small bilateral  hydroceles. Electronically Signed   By: Harmon Pier M.D.   On: 04/30/2018 10:28   Ct Abdomen Pelvis W Contrast  Result Date: 04/30/2018 CLINICAL DATA:  Epigastric and low pelvic pain. Enlarged right testicle. Bloating. Jaundice for 2 weeks. Right testicular pain beginning 2 weeks ago while running. EXAM: CT ABDOMEN AND PELVIS WITH CONTRAST TECHNIQUE: Multidetector CT imaging of the abdomen and pelvis was performed using the standard protocol following bolus administration of intravenous contrast. CONTRAST:  ISOVUE-300 IOPAMIDOL (ISOVUE-300) INJECTION 61% COMPARISON:  None. FINDINGS: Lower chest: The lung bases are clear. Hepatobiliary: Prominent diffuse fatty infiltration of the liver. No focal liver lesions identified. Gallbladder is somewhat distended but there is no wall thickening, inflammatory infiltration, or stones identified. No bile duct dilatation. Pancreas: Unremarkable. No pancreatic ductal dilatation or surrounding inflammatory changes. Spleen: Normal in size without focal abnormality. Adrenals/Urinary Tract: No adrenal gland nodules. Kidneys are symmetrical. Nephrograms are homogeneous. No hydronephrosis or hydroureter. Bladder wall is mildly thickened, possibly indicating cystitis. No filling defects or stones identified. Stomach/Bowel: Stomach is within normal limits. Appendix is not identified. No evidence of bowel wall thickening, distention, or inflammatory changes. Vascular/Lymphatic: No significant vascular findings are present. No enlarged abdominal or pelvic lymph nodes. Reproductive: Prostate gland is enlarged, measuring 4.3 cm diameter. Prostate calcifications are present. Other: No abdominal wall hernia or abnormality. No abdominopelvic ascites. Musculoskeletal: No acute or significant osseous findings. IMPRESSION: 1. Prominent diffuse fatty infiltration of the liver. No focal liver lesions identified. 2. Bladder wall thickening, possibly indicating cystitis. 3. No evidence of  bowel obstruction or inflammation. 4. Enlarged prostate gland. Electronically Signed   By: Marisa Cyphers.D.  On: 04/30/2018 01:42   [place] Pelvic Doppler (torsion R/o Or Mass Arterial Flow)  Result Date: 04/30/2018 CLINICAL DATA:  37 year old male with RIGHT testicular pain for 2 weeks. EXAM: SCROTAL ULTRASOUND DOPPLER ULTRASOUND OF THE TESTICLES TECHNIQUE: Complete ultrasound examination of the testicles, epididymis, and other scrotal structures was performed. Color and spectral Doppler ultrasound were also utilized to evaluate blood flow to the testicles. COMPARISON:  None. FINDINGS: Right testicle Measurements: 3.8 x 2.1 x 2.4 cm. No mass or microlithiasis visualized. Left testicle Measurements: 4.2 x 1.8 x 2.8 cm. No mass or microlithiasis visualized. Right epididymis:  Normal in size and appearance. Left epididymis:  Normal in size and appearance. Hydrocele:  Very small bilateral hydroceles noted. Varicocele:  None visualized. Pulsed Doppler interrogation of both testes demonstrates normal low resistance arterial and venous waveforms bilaterally. Scrotal calcifications are chronic, compatible with prior infection or inflammation. IMPRESSION: 1. Normal testicles bilaterally. No evidence of testicular torsion or mass. 2. Very small bilateral hydroceles. Electronically Signed   By: Harmon PierJeffrey  Hu M.D.   On: 04/30/2018 10:28   [place] Abdomen Limited Ruq  Result Date: 05/02/2018 CLINICAL DATA:  Bilirubinemia EXAM: ULTRASOUND ABDOMEN LIMITED RIGHT UPPER QUADRANT COMPARISON:  04/30/2018 FINDINGS: Gallbladder: No gallstones or wall thickening visualized. No sonographic Murphy sign noted by sonographer. Common bile duct: Diameter: Normal caliber, 2 mm Liver: Increased echotexture compatible with fatty infiltration. No focal abnormality or biliary ductal dilatation. Liver appears prominent, possible hepatomegaly. Portal vein is patent on color Doppler imaging with normal direction of blood flow towards the liver.  IMPRESSION: No evidence of cholelithiasis or cholecystitis. Fatty infiltration of the liver, likely hepatomegaly. Electronically Signed   By: Charlett NoseKevin  Dover M.D.   On: 05/02/2018 10:14        Discharge Exam: Vitals:   05/03/18 0504 05/03/18 1538  BP: 119/82 (!) 137/96  Pulse: 97 (!) 105  Resp: 20 20  Temp: 98.2 F (36.8 C) 98.4 F (36.9 C)  SpO2: 100% 100%   Vitals:   05/02/18 1518 05/02/18 1947 05/03/18 0504 05/03/18 1538  BP:  (!) 128/98 119/82 (!) 137/96  Pulse:  83 97 (!) 105  Resp:  16 20 20   Temp:  98.2 F (36.8 C) 98.2 F (36.8 C) 98.4 F (36.9 C)  TempSrc:  Oral Oral   SpO2: 98% 100% 100% 100%  Weight:      Height:        General: Pt is alert, awake, not in acute distress Cardiovascular: RRR, S1/S2 +, no rubs, no gallops Respiratory: CTA bilaterally, no wheezing, no rhonchi Abdominal: Soft, RUQ, ND, bowel sounds + Extremities: no edema, no cyanosis   The results of significant diagnostics from this hospitalization (including imaging, microbiology, ancillary and laboratory) are listed below for reference.    Significant Diagnostic Studies: [place] Scrotum  Result Date: 04/30/2018 CLINICAL DATA:  37 year old male with RIGHT testicular pain for 2 weeks. EXAM: SCROTAL ULTRASOUND DOPPLER ULTRASOUND OF THE TESTICLES TECHNIQUE: Complete ultrasound examination of the testicles, epididymis, and other scrotal structures was performed. Color and spectral Doppler ultrasound were also utilized to evaluate blood flow to the testicles. COMPARISON:  None. FINDINGS: Right testicle Measurements: 3.8 x 2.1 x 2.4 cm. No mass or microlithiasis visualized. Left testicle Measurements: 4.2 x 1.8 x 2.8 cm. No mass or microlithiasis visualized. Right epididymis:  Normal in size and appearance. Left epididymis:  Normal in size and appearance. Hydrocele:  Very small bilateral hydroceles noted. Varicocele:  None visualized. Pulsed Doppler interrogation of both testes demonstrates normal low  resistance arterial and venous waveforms bilaterally. Scrotal calcifications are chronic, compatible with prior infection or inflammation. IMPRESSION: 1. Normal testicles bilaterally. No evidence of testicular torsion or mass. 2. Very small bilateral hydroceles. Electronically Signed   By: Harmon PierJeffrey  Hu M.D.   On: 04/30/2018 10:28   Ct Abdomen Pelvis W Contrast  Result Date: 04/30/2018 CLINICAL DATA:  Epigastric and low pelvic pain. Enlarged right testicle. Bloating. Jaundice for 2 weeks. Right testicular pain beginning 2 weeks ago while running. EXAM: CT ABDOMEN AND PELVIS WITH CONTRAST TECHNIQUE: Multidetector CT imaging of the abdomen and pelvis was performed using the standard protocol following bolus administration of intravenous contrast. CONTRAST:  100mL ISOVUE-300 IOPAMIDOL (ISOVUE-300) INJECTION 61% COMPARISON:  None. FINDINGS: Lower chest: The lung bases are clear. Hepatobiliary: Prominent diffuse fatty infiltration of the liver. No focal liver lesions identified. Gallbladder is somewhat distended but there is no wall thickening, inflammatory infiltration, or stones identified. No bile duct dilatation. Pancreas: Unremarkable. No pancreatic ductal dilatation or surrounding inflammatory changes. Spleen: Normal in size without focal abnormality. Adrenals/Urinary Tract: No adrenal gland nodules. Kidneys are symmetrical. Nephrograms are homogeneous. No hydronephrosis or hydroureter. Bladder wall is mildly thickened, possibly indicating cystitis. No filling defects or stones identified. Stomach/Bowel: Stomach is within normal limits. Appendix is not identified. No evidence of bowel wall thickening, distention, or inflammatory changes. Vascular/Lymphatic: No significant vascular findings are present. No enlarged abdominal or pelvic lymph nodes. Reproductive: Prostate gland is enlarged, measuring 4.3 cm diameter. Prostate calcifications are present. Other: No abdominal wall hernia or abnormality. No  abdominopelvic ascites. Musculoskeletal: No acute or significant osseous findings. IMPRESSION: 1. Prominent diffuse fatty infiltration of the liver. No focal liver lesions identified. 2. Bladder wall thickening, possibly indicating cystitis. 3. No evidence of bowel obstruction or inflammation. 4. Enlarged prostate gland. Electronically Signed   By: Burman NievesWilliam  Stevens M.D.   On: 04/30/2018 01:42   [place] Pelvic Doppler (torsion R/o Or Mass Arterial Flow)  Result Date: 04/30/2018 CLINICAL DATA:  37 year old male with RIGHT testicular pain for 2 weeks. EXAM: SCROTAL ULTRASOUND DOPPLER ULTRASOUND OF THE TESTICLES TECHNIQUE: Complete ultrasound examination of the testicles, epididymis, and other scrotal structures was performed. Color and spectral Doppler ultrasound were also utilized to evaluate blood flow to the testicles. COMPARISON:  None. FINDINGS: Right testicle Measurements: 3.8 x 2.1 x 2.4 cm. No mass or microlithiasis visualized. Left testicle Measurements: 4.2 x 1.8 x 2.8 cm. No mass or microlithiasis visualized. Right epididymis:  Normal in size and appearance. Left epididymis:  Normal in size and appearance. Hydrocele:  Very small bilateral hydroceles noted. Varicocele:  None visualized. Pulsed Doppler interrogation of both testes demonstrates normal low resistance arterial and venous waveforms bilaterally. Scrotal calcifications are chronic, compatible with prior infection or inflammation. IMPRESSION: 1. Normal testicles bilaterally. No evidence of testicular torsion or mass. 2. Very small bilateral hydroceles. Electronically Signed   By: Harmon PierJeffrey  Hu M.D.   On: 04/30/2018 10:28   [place] Abdomen Limited Ruq  Result Date: 05/02/2018 CLINICAL DATA:  Bilirubinemia EXAM: ULTRASOUND ABDOMEN LIMITED RIGHT UPPER QUADRANT COMPARISON:  04/30/2018 FINDINGS: Gallbladder: No gallstones or wall thickening visualized. No sonographic Murphy sign noted by sonographer. Common bile duct: Diameter: Normal caliber, 2 mm Liver:  Increased echotexture compatible with fatty infiltration. No focal abnormality or biliary ductal dilatation. Liver appears prominent, possible hepatomegaly. Portal vein is patent on color Doppler imaging with normal direction of blood flow towards the liver. IMPRESSION: No evidence of cholelithiasis or cholecystitis. Fatty infiltration of the liver, likely hepatomegaly. Electronically Signed  By: Charlett Nose M.D.   On: 05/02/2018 10:14     Microbiology: No results found for this or any previous visit (from the past 240 hour(s)).   Labs: Basic Metabolic Panel: Recent Labs  Lab 04/30/18 0024 05/01/18 0501 05/03/18 0517  NA 134* 135 136  K 3.7 3.5 3.4*  CL 95* 99 103  CO2 27 26 25   GLUCOSE 100* 90 93  BUN <5* <5* 6  CREATININE 0.37* 0.33* 0.47*  CALCIUM 8.8* 8.7* 8.5*  MG  --   --  1.4*   Liver Function Tests: Recent Labs  Lab 04/30/18 0024 05/01/18 0501 05/03/18 0517  AST 244* 163* 139*  ALT 57* 44 51*  ALKPHOS 365* 296* 274*  BILITOT 5.0* 5.0* 4.5*  PROT 7.1 6.1* 6.0*  ALBUMIN 3.2* 2.8* 2.8*   Recent Labs  Lab 04/30/18 0024  LIPASE 37   No results for input(s): AMMONIA in the last 168 hours. CBC: Recent Labs  Lab 04/30/18 0024 05/03/18 0517  WBC 9.8 8.1  NEUTROABS 7.3  --   HGB 13.1 11.8*  HCT 37.9* 35.2*  MCV 102.7* 104.5*  PLT 170 177   Cardiac Enzymes: No results for input(s): CKTOTAL, CKMB, CKMBINDEX, TROPONINI in the last 168 hours. BNP: Invalid input(s): POCBNP CBG: No results for input(s): GLUCAP in the last 168 hours.  Time coordinating discharge:  36 minutes  Signed:  Catarina Hartshorn, DO [facility] Pager: [phone] 05/03/2018, 5:03 PM
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
## 2018-05-04 LAB — BASIC METABOLIC PANEL
ANION GAP: 7 (ref 5–15)
BUN: 6 mg/dL (ref 6–20)
CALCIUM: 8.6 mg/dL — AB (ref 8.9–10.3)
CO2: 26 mmol/L (ref 22–32)
Chloride: 105 mmol/L (ref 98–111)
Creatinine, Ser: 0.45 mg/dL — ABNORMAL LOW (ref 0.61–1.24)
Glucose, Bld: 90 mg/dL (ref 70–99)
POTASSIUM: 3.7 mmol/L (ref 3.5–5.1)
Sodium: 138 mmol/L (ref 135–145)

## 2018-05-04 LAB — MAGNESIUM: MAGNESIUM: 1.4 mg/dL — AB (ref 1.7–2.4)

## 2018-05-04 NOTE — Progress Notes (Addendum)
Physician Discharge Summary  Tom Herman ZOX:[id number] DOB: [birth year] DOA: 04/29/2018  PCP: Patient, No Pcp Per  Admit date: 04/29/2018 Discharge date: 05/04/2018  Admitted From: Home Disposition:  Home   Recommendations for Outpatient Follow-up:  1. Follow up with PCP in 1-2 weeks 2. Please obtain BMP/CBC in one week     Discharge Condition: Stable CODE STATUS: FULL Diet recommendation: Heart Healthy   Brief/Interim Summary: 37 y.o.malewho denies any significant past medical history, now presenting to the emergency department for evaluation of abdominal pain, nausea with nonbloody vomiting, anorexia, and pain in the right testicle. Patient reports a long history of excessive alcohol use and states that Tom Herman has been drinking significantly more over the past 10 months, during which time Tom Herman has begun drinking hard liquor from the time Tom Herman wakes up. For the past few months, Tom Herman has had progressive loss of appetite with frequent nausea and nonbloody vomiting. Tom Herman has had little if any appetite for the past couple weeks and has had epigastric and right upper quadrant abdominal pain. Others have noted that his eyes have appeared yellow. Tom Herman denies history of IV drug use. Reports that Tom Herman wants to quit drinking and has attempted to cut back, but these attempts have been complicated by withdrawal symptoms.  The patient was admitted and placed on alcohol withdrawal protocol and Tom Herman gradually improved.  Tom Herman was initially placed on librium.  This was gradually weaned without any worsening of his withdrawal symptoms.  However, on 05/03/18 afternoon, the patient was angered that Tom Herman would not be allowed to smoke despite already having a nicoderm patch.  Therefore, the patient wanted to leave against medical advice.  After a period of time, the patient realized that Tom Herman did not have transportation.  As result, the patient agreed to stay in the hospital for the remainder of his treatment.  The patient  continued to improve clinically.  His Librium was weaned off, and his CIWA scores continued to improve.  Tom Herman no longer had any tremors or visual hallucinations.  His hematochezia  improved.  Tom Herman tolerated his diet without difficulty.  His safety sitter was discontinued, and the patient remained clinically stable without concerns for harm to himself or others.  The patient had denied any suicidal or homicidal ideations throughout his hospitalization.  However, it is noted that the patient has little desire to stop alcohol use despite multiple discussions from myself as well as social work.  Nevertheless, the patient was given the appropriate resources should Tom Herman desire sobriety at some point.  Discharge Diagnoses:  Intractable vomiting -Secondary to alcoholic gastritis -04/30/2018 CT abdomen/pelvis--fatty liver, bowel wall thickening, enlarged prostate;no bowel wall thickening -Saline lock IV fluids as vomiting is improving -Continue PPI -tolerating soft diet  Alcohol withdrawal -Decrease Librium--continue to wean off -ContinueCIWA scale  Alcoholic hepatitis -LFTs trending down -Viral hepatitis serologies negative -HIV negative  Testicular pain -Scrotal ultrasound negative for acute findings except for very small bilateral hydroceles -Improving  Tobacco abuse I have discussed tobacco cessation with the patient. I have counseled the patient regarding the negative impacts of continued tobacco use including but not limited to lung cancer, COPD, and cardiovascular disease. I have discussed alternatives to tobacco and modalities that may help facilitate tobacco cessation including but not limited to biofeedback, hypnosis, and medications. Total time spent with tobacco counseling was 4 minutes.  Severe protein calorie malnutrition -Continue supplements  Hematochezia -Consult GI appreciated -pt offered colonoscopy, but Tom Herman declined further work up at this time -Hgb  stable    Discharge Instructions   Allergies as of 05/04/2018   No Known Allergies     Medication List    TAKE these medications   multivitamin with minerals Tabs tablet Take 1 tablet by mouth daily.       No Known Allergies  Consultations:  none   Procedures/Studies: US Scrotum  Result Date: 04/30/2018 CLINICAL DATA:  37 year old male with RIGHT testicular pain for 2 weeks. EXAM: SCROTAL ULTRASOUND DOPPLER ULTRASOUND OF THE TESTICLES TECHNIQUE: Complete ultrasound examination of the testicles, epididymis, and other scrotal structures was performed. Color and spectral Doppler ultrasound were also utilized to evaluate blood flow to the testicles. COMPARISON:  None. FINDINGS: Right testicle Measurements: 3.8 x 2.1 x 2.4 cm. No mass or microlithiasis visualized. Left testicle Measurements: 4.2 x 1.8 x 2.8 cm. No mass or microlithiasis visualized. Right epididymis:  Normal in size and appearance. Left epididymis:  Normal in size and appearance. Hydrocele:  Very small bilateral hydroceles noted. Varicocele:  None visualized. Pulsed Doppler interrogation of both testes demonstrates normal low resistance arterial and venous waveforms bilaterally. Scrotal calcifications are chronic, compatible with prior infection or inflammation. IMPRESSION: 1. Normal testicles bilaterally. No evidence of testicular torsion or mass. 2. Very small bilateral hydroceles. Electronically Signed   By: Harmon Pier M.D.   On: 04/30/2018 10:28   Ct Abdomen Pelvis W Contrast  Result Date: 04/30/2018 CLINICAL DATA:  Epigastric and low pelvic pain. Enlarged right testicle. Bloating. Jaundice for 2 weeks. Right testicular pain beginning 2 weeks ago while running. EXAM: CT ABDOMEN AND PELVIS WITH CONTRAST TECHNIQUE: Multidetector CT imaging of the abdomen and pelvis was performed using the standard protocol following bolus administration of intravenous contrast. CONTRAST:  ISOVUE-300 IOPAMIDOL (ISOVUE-300)  INJECTION 61% COMPARISON:  None. FINDINGS: Lower chest: The lung bases are clear. Hepatobiliary: Prominent diffuse fatty infiltration of the liver. No focal liver lesions identified. Gallbladder is somewhat distended but there is no wall thickening, inflammatory infiltration, or stones identified. No bile duct dilatation. Pancreas: Unremarkable. No pancreatic ductal dilatation or surrounding inflammatory changes. Spleen: Normal in size without focal abnormality. Adrenals/Urinary Tract: No adrenal gland nodules. Kidneys are symmetrical. Nephrograms are homogeneous. No hydronephrosis or hydroureter. Bladder wall is mildly thickened, possibly indicating cystitis. No filling defects or stones identified. Stomach/Bowel: Stomach is within normal limits. Appendix is not identified. No evidence of bowel wall thickening, distention, or inflammatory changes. Vascular/Lymphatic: No significant vascular findings are present. No enlarged abdominal or pelvic lymph nodes. Reproductive: Prostate gland is enlarged, measuring 4.3 cm diameter. Prostate calcifications are present. Other: No abdominal wall hernia or abnormality. No abdominopelvic ascites. Musculoskeletal: No acute or significant osseous findings. IMPRESSION: 1. Prominent diffuse fatty infiltration of the liver. No focal liver lesions identified. 2. Bladder wall thickening, possibly indicating cystitis. 3. No evidence of bowel obstruction or inflammation. 4. Enlarged prostate gland. Electronically Signed   By: Burman Nieves M.D.   On: 04/30/2018 01:42   US Pelvic Doppler (torsion R/o Or Mass Arterial Flow)  Result Date: 04/30/2018 CLINICAL DATA:  37 year old male with RIGHT testicular pain for 2 weeks. EXAM: SCROTAL ULTRASOUND DOPPLER ULTRASOUND OF THE TESTICLES TECHNIQUE: Complete ultrasound examination of the testicles, epididymis, and other scrotal structures was performed. Color and spectral Doppler ultrasound were also utilized to evaluate blood flow to the  testicles. COMPARISON:  None. FINDINGS: Right testicle Measurements: 3.8 x 2.1 x 2.4 cm. No mass or microlithiasis visualized. Left testicle Measurements: 4.2 x 1.8 x 2.8 cm. No mass or microlithiasis visualized. Right  epididymis:  Normal in size and appearance. Left epididymis:  Normal in size and appearance. Hydrocele:  Very small bilateral hydroceles noted. Varicocele:  None visualized. Pulsed Doppler interrogation of both testes demonstrates normal low resistance arterial and venous waveforms bilaterally. Scrotal calcifications are chronic, compatible with prior infection or inflammation. IMPRESSION: 1. Normal testicles bilaterally. No evidence of testicular torsion or mass. 2. Very small bilateral hydroceles. Electronically Signed   By: Harmon PierJeffrey  Hu M.D.   On: 04/30/2018 10:28   [place] Abdomen Limited Ruq  Result Date: 05/02/2018 CLINICAL DATA:  Bilirubinemia EXAM: ULTRASOUND ABDOMEN LIMITED RIGHT UPPER QUADRANT COMPARISON:  04/30/2018 FINDINGS: Gallbladder: No gallstones or wall thickening visualized. No sonographic Murphy sign noted by sonographer. Common bile duct: Diameter: Normal caliber, 2 mm Liver: Increased echotexture compatible with fatty infiltration. No focal abnormality or biliary ductal dilatation. Liver appears prominent, possible hepatomegaly. Portal vein is patent on color Doppler imaging with normal direction of blood flow towards the liver. IMPRESSION: No evidence of cholelithiasis or cholecystitis. Fatty infiltration of the liver, likely hepatomegaly. Electronically Signed   By: Charlett NoseKevin  Dover M.D.   On: 05/02/2018 10:14         Discharge Exam: Vitals:   05/03/18 2128 05/04/18 0540  BP: (!) 135/109 123/82  Pulse: 100 79  Resp: 18 16  Temp: 98.5 F (36.9 C) 98.1 F (36.7 C)  SpO2: 99% 100%   Vitals:   05/03/18 0504 05/03/18 1538 05/03/18 2128 05/04/18 0540  BP: 119/82 (!) 137/96 (!) 135/109 123/82  Pulse: 97 (!) 105 100 79  Resp: 20 20 18 16   Temp: 98.2 F (36.8 C) 98.4  F (36.9 C) 98.5 F (36.9 C) 98.1 F (36.7 C)  TempSrc: Oral  Oral Oral  SpO2: 100% 100% 99% 100%  Weight:      Height:        General: Pt is alert, awake, not in acute distress Cardiovascular: RRR, S1/S2 +, no rubs, no gallops Respiratory: diminished BS, but CTA bilaterally, no wheezing, no rhonchi Abdominal: Soft, NT, ND, bowel sounds + Extremities: no edema, no cyanosis   The results of significant diagnostics from this hospitalization (including imaging, microbiology, ancillary and laboratory) are listed below for reference.    Significant Diagnostic Studies: [place] Scrotum  Result Date: 04/30/2018 CLINICAL DATA:  37 year old male with RIGHT testicular pain for 2 weeks. EXAM: SCROTAL ULTRASOUND DOPPLER ULTRASOUND OF THE TESTICLES TECHNIQUE: Complete ultrasound examination of the testicles, epididymis, and other scrotal structures was performed. Color and spectral Doppler ultrasound were also utilized to evaluate blood flow to the testicles. COMPARISON:  None. FINDINGS: Right testicle Measurements: 3.8 x 2.1 x 2.4 cm. No mass or microlithiasis visualized. Left testicle Measurements: 4.2 x 1.8 x 2.8 cm. No mass or microlithiasis visualized. Right epididymis:  Normal in size and appearance. Left epididymis:  Normal in size and appearance. Hydrocele:  Very small bilateral hydroceles noted. Varicocele:  None visualized. Pulsed Doppler interrogation of both testes demonstrates normal low resistance arterial and venous waveforms bilaterally. Scrotal calcifications are chronic, compatible with prior infection or inflammation. IMPRESSION: 1. Normal testicles bilaterally. No evidence of testicular torsion or mass. 2. Very small bilateral hydroceles. Electronically Signed   By: Harmon PierJeffrey  Hu M.D.   On: 04/30/2018 10:28   Ct Abdomen Pelvis W Contrast  Result Date: 04/30/2018 CLINICAL DATA:  Epigastric and low pelvic pain. Enlarged right testicle. Bloating. Jaundice for 2 weeks. Right testicular pain  beginning 2 weeks ago while running. EXAM: CT ABDOMEN AND PELVIS WITH CONTRAST TECHNIQUE: Multidetector CT  imaging of the abdomen and pelvis was performed using the standard protocol following bolus administration of intravenous contrast. CONTRAST:  ISOVUE-300 IOPAMIDOL (ISOVUE-300) INJECTION 61% COMPARISON:  None. FINDINGS: Lower chest: The lung bases are clear. Hepatobiliary: Prominent diffuse fatty infiltration of the liver. No focal liver lesions identified. Gallbladder is somewhat distended but there is no wall thickening, inflammatory infiltration, or stones identified. No bile duct dilatation. Pancreas: Unremarkable. No pancreatic ductal dilatation or surrounding inflammatory changes. Spleen: Normal in size without focal abnormality. Adrenals/Urinary Tract: No adrenal gland nodules. Kidneys are symmetrical. Nephrograms are homogeneous. No hydronephrosis or hydroureter. Bladder wall is mildly thickened, possibly indicating cystitis. No filling defects or stones identified. Stomach/Bowel: Stomach is within normal limits. Appendix is not identified. No evidence of bowel wall thickening, distention, or inflammatory changes. Vascular/Lymphatic: No significant vascular findings are present. No enlarged abdominal or pelvic lymph nodes. Reproductive: Prostate gland is enlarged, measuring 4.3 cm diameter. Prostate calcifications are present. Other: No abdominal wall hernia or abnormality. No abdominopelvic ascites. Musculoskeletal: No acute or significant osseous findings. IMPRESSION: 1. Prominent diffuse fatty infiltration of the liver. No focal liver lesions identified. 2. Bladder wall thickening, possibly indicating cystitis. 3. No evidence of bowel obstruction or inflammation. 4. Enlarged prostate gland. Electronically Signed   By: Burman Nieves M.D.   On: 04/30/2018 01:42   US Pelvic Doppler (torsion R/o Or Mass Arterial Flow)  Result Date: 04/30/2018 CLINICAL DATA:  37 year old male with RIGHT  testicular pain for 2 weeks. EXAM: SCROTAL ULTRASOUND DOPPLER ULTRASOUND OF THE TESTICLES TECHNIQUE: Complete ultrasound examination of the testicles, epididymis, and other scrotal structures was performed. Color and spectral Doppler ultrasound were also utilized to evaluate blood flow to the testicles. COMPARISON:  None. FINDINGS: Right testicle Measurements: 3.8 x 2.1 x 2.4 cm. No mass or microlithiasis visualized. Left testicle Measurements: 4.2 x 1.8 x 2.8 cm. No mass or microlithiasis visualized. Right epididymis:  Normal in size and appearance. Left epididymis:  Normal in size and appearance. Hydrocele:  Very small bilateral hydroceles noted. Varicocele:  None visualized. Pulsed Doppler interrogation of both testes demonstrates normal low resistance arterial and venous waveforms bilaterally. Scrotal calcifications are chronic, compatible with prior infection or inflammation. IMPRESSION: 1. Normal testicles bilaterally. No evidence of testicular torsion or mass. 2. Very small bilateral hydroceles. Electronically Signed   By: Harmon Pier M.D.   On: 04/30/2018 10:28   US Abdomen Limited Ruq  Result Date: 05/02/2018 CLINICAL DATA:  Bilirubinemia EXAM: ULTRASOUND ABDOMEN LIMITED RIGHT UPPER QUADRANT COMPARISON:  04/30/2018 FINDINGS: Gallbladder: No gallstones or wall thickening visualized. No sonographic Murphy sign noted by sonographer. Common bile duct: Diameter: Normal caliber, 2 mm Liver: Increased echotexture compatible with fatty infiltration. No focal abnormality or biliary ductal dilatation. Liver appears prominent, possible hepatomegaly. Portal vein is patent on color Doppler imaging with normal direction of blood flow towards the liver. IMPRESSION: No evidence of cholelithiasis or cholecystitis. Fatty infiltration of the liver, likely hepatomegaly. Electronically Signed   By: Charlett Nose M.D.   On: 05/02/2018 10:14     Microbiology: No results found for this or any previous visit (from the past  240 hour(s)).   Labs: Basic Metabolic Panel: Recent Labs  Lab 04/30/18 0024 05/01/18 0501 05/03/18 0517  NA 134* 135 136  K 3.7 3.5 3.4*  CL 95* 99 103  CO2 27 26 25   GLUCOSE 100* 90 93  BUN <5* <5* 6  CREATININE 0.37* 0.33* 0.47*  CALCIUM 8.8* 8.7* 8.5*  MG  --   --  1.4*   Liver Function Tests: Recent Labs  Lab 04/30/18 0024 05/01/18 0501 05/03/18 0517  AST 244* 163* 139*  ALT 57* 44 51*  ALKPHOS 365* 296* 274*  BILITOT 5.0* 5.0* 4.5*  PROT 7.1 6.1* 6.0*  ALBUMIN 3.2* 2.8* 2.8*   Recent Labs  Lab 04/30/18 0024  LIPASE 37   No results for input(s): AMMONIA in the last 168 hours. CBC: Recent Labs  Lab 04/30/18 0024 05/03/18 0517  WBC 9.8 8.1  NEUTROABS 7.3  --   HGB 13.1 11.8*  HCT 37.9* 35.2*  MCV 102.7* 104.5*  PLT 170 177   Cardiac Enzymes: No results for input(s): CKTOTAL, CKMB, CKMBINDEX, TROPONINI in the last 168 hours. BNP: Invalid input(s): POCBNP CBG: No results for input(s): GLUCAP in the last 168 hours.  Time coordinating discharge:  36 minutes  Signed:  Catarina Hartshorn, DO [facility] Pager: [phone] 05/04/2018, 7:09 AM

## 2018-06-24 ENCOUNTER — Other Ambulatory Visit: Payer: Self-pay

## 2018-06-24 ENCOUNTER — Emergency Department (HOSPITAL_COMMUNITY): Payer: Self-pay

## 2018-06-24 ENCOUNTER — Encounter (HOSPITAL_COMMUNITY): Payer: Self-pay

## 2018-06-24 ENCOUNTER — Emergency Department (HOSPITAL_COMMUNITY)
Admission: EM | Admit: 2018-06-24 | Discharge: 2018-06-24 | Disposition: A | Discharge status: Home / Self Care | Payer: Self-pay | Attending: Emergency Medicine | Admitting: Emergency Medicine

## 2018-06-24 DIAGNOSIS — R770 Abnormality of albumin: Secondary | ICD-10-CM | POA: Insufficient documentation

## 2018-06-24 DIAGNOSIS — K7010 Alcoholic hepatitis without ascites: Secondary | ICD-10-CM | POA: Insufficient documentation

## 2018-06-24 DIAGNOSIS — R609 Edema, unspecified: Secondary | ICD-10-CM

## 2018-06-24 DIAGNOSIS — E8809 Other disorders of plasma-protein metabolism, not elsewhere classified: Secondary | ICD-10-CM

## 2018-06-24 DIAGNOSIS — F17210 Nicotine dependence, cigarettes, uncomplicated: Secondary | ICD-10-CM | POA: Insufficient documentation

## 2018-06-24 DIAGNOSIS — Z79899 Other long term (current) drug therapy: Secondary | ICD-10-CM | POA: Insufficient documentation

## 2018-06-24 LAB — COMPREHENSIVE METABOLIC PANEL
ALT: 19 U/L (ref 0–44)
ANION GAP: 8 (ref 5–15)
AST: 46 U/L — ABNORMAL HIGH (ref 15–41)
Albumin: 2.8 g/dL — ABNORMAL LOW (ref 3.5–5.0)
Alkaline Phosphatase: 100 U/L (ref 38–126)
BUN: 5 mg/dL — ABNORMAL LOW (ref 6–20)
CALCIUM: 9.2 mg/dL (ref 8.9–10.3)
CHLORIDE: 103 mmol/L (ref 98–111)
CO2: 25 mmol/L (ref 22–32)
Creatinine, Ser: 0.66 mg/dL (ref 0.61–1.24)
GFR calc non Af Amer: >60 mL/min (ref >60)
Glucose, Bld: 98 mg/dL (ref 70–99)
Potassium: 4.6 mmol/L (ref 3.5–5.1)
SODIUM: 136 mmol/L (ref 135–145)
Total Bilirubin: 2.5 mg/dL — ABNORMAL HIGH (ref 0.3–1.2)
Total Protein: 6.3 g/dL — ABNORMAL LOW (ref 6.5–8.1)

## 2018-06-24 LAB — I-STAT TROPONIN, ED: TROPONIN I, POC: 0 ng/mL (ref 0.00–0.08)

## 2018-06-24 LAB — URINALYSIS, ROUTINE W REFLEX MICROSCOPIC
Bilirubin Urine: NEGATIVE
Glucose, UA: NEGATIVE mg/dL
Hgb urine dipstick: NEGATIVE
KETONES UR: NEGATIVE mg/dL
Leukocytes, UA: NEGATIVE
NITRITE: NEGATIVE
PH: 7 (ref 5.0–8.0)
Protein, ur: NEGATIVE mg/dL
SPECIFIC GRAVITY, URINE: 1.002 — AB (ref 1.005–1.030)

## 2018-06-24 LAB — CBC
HCT: 35 % — ABNORMAL LOW (ref 39.0–52.0)
Hemoglobin: 11.7 g/dL — ABNORMAL LOW (ref 13.0–17.0)
MCH: 33 pg (ref 26.0–34.0)
MCHC: 33.4 g/dL (ref 30.0–36.0)
MCV: 98.6 fL (ref 78.0–100.0)
PLATELETS: 145 10*3/uL — AB (ref 150–400)
RBC: 3.55 MIL/uL — AB (ref 4.22–5.81)
RDW: 13.3 % (ref 11.5–15.5)
WBC: 7.4 10*3/uL (ref 4.0–10.5)

## 2018-06-24 LAB — PROTIME-INR
INR: 1.46
PROTHROMBIN TIME: 17.6 s — AB (ref 11.4–15.2)

## 2018-06-24 LAB — POC OCCULT BLOOD, ED: FECAL OCCULT BLD: NEGATIVE

## 2018-06-24 LAB — LIPASE, BLOOD: LIPASE: 36 U/L (ref 11–51)

## 2018-06-24 NOTE — ED Triage Notes (Signed)
Pt c.o right foot swelling that started last night and testicle swelling that has been going on for the past 3 months. Pt states he was seen at [facility] last month for detox from alcohol. Pt also c.o chest pain that has been going on since last month as well, was told it would subside after detoxing from alcohol but states it is still there. Pt reports staying sober since then. No obvious swelling or redness noted in right leg.

## 2018-06-24 NOTE — Discharge Instructions (Signed)
Thank you for allowing me to care for you today in the Emergency Department.   You can take 600 mg of ibuprofen with food every 8 hours for pain.  For the swelling in your right foot and in your groin, call to schedule a follow-up appointment with gastroenterology or follow-up with the [facility] [place].  You should return to the emergency department for evaluation if you develop persistent vomiting, blood in your stool, dark black stool, or other new, concerning symptoms.
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
## 2018-06-24 NOTE — ED Notes (Signed)
Patient verbalizes understanding of discharge instructions. Opportunity for questioning and answers were provided. Armband removed by staff, pt discharged from ED.  

## 2018-07-10 ENCOUNTER — Encounter: Payer: Self-pay | Admitting: Physician Assistant

## 2018-07-10 ENCOUNTER — Ambulatory Visit: Payer: Self-pay | Admitting: Physician Assistant

## 2018-07-10 VITALS — BP 120/60 | HR 104 | Temp 97.9°F | Ht 69.25 in | Wt 140.2 lb

## 2018-07-10 DIAGNOSIS — E46 Unspecified protein-calorie malnutrition: Secondary | ICD-10-CM

## 2018-07-10 DIAGNOSIS — N50811 Right testicular pain: Secondary | ICD-10-CM

## 2018-07-10 DIAGNOSIS — F17200 Nicotine dependence, unspecified, uncomplicated: Secondary | ICD-10-CM

## 2018-07-10 DIAGNOSIS — F1011 Alcohol abuse, in remission: Secondary | ICD-10-CM

## 2018-07-10 DIAGNOSIS — K219 Gastro-esophageal reflux disease without esophagitis: Secondary | ICD-10-CM

## 2018-07-10 DIAGNOSIS — K7010 Alcoholic hepatitis without ascites: Secondary | ICD-10-CM

## 2018-07-10 DIAGNOSIS — Z7689 Persons encountering health services in other specified circumstances: Secondary | ICD-10-CM

## 2018-07-10 MED ORDER — OMEPRAZOLE 40 MG PO CPDR: 40.0000 mg | DELAYED_RELEASE_CAPSULE | Freq: Every day | ORAL | 1 refills | Status: DC

## 2018-07-10 NOTE — Progress Notes (Signed)
BP 120/60 (BP Location: Left Arm, Patient Position: Sitting, Cuff Size: Normal)   Pulse (!) 104   Temp 97.9 F (36.6 C)   Ht 5' 9.25" (1.759 m)   Wt 140 lb 4 oz (63.6 kg)   SpO2 98%   BMI 20.56 kg/m    Subjective:    Patient ID: Tom Herman, male    DOB: 03-Aug-1981, 37 y.o.   MRN: [id number]  HPI: Tom Herman is a 37 y.o. male presenting on 07/10/2018 for New Patient (Initial Visit)   HPI  Pt c/o chest pain and foot swelling and swelling.  Pt states CP after he eats and sometimes when he is walking through his house and sometimes when he is sitting around watching TV.   It lasts for hours sometimes.  Sometimes he will hit himself in the chest and that helps.    He says the foot has been swelling since his last visit to the ER on 10/6.    He says his testicle swelling gets worse sometimes and better other times.  he Says it swells up sometimes if he walks up stairs.  He had [place] in August that showed small hydroceles but otherwise normal.   Pt says some anxiety and depression.  It comes and goes.   Chart review- CMP from august to October shows LFTs much improved but albumin is still low.   Pt says he hasn't been eating well for at least a year.  he says it's a combination of alcoholism and depression.  He says he hasn't drank alcohol since August.   Pt is taking oxycodones he got without a prescription (from an [place])  Relevant past medical, surgical, family and social history reviewed and updated as indicated. Interim medical history since our last visit reviewed. Allergies and medications reviewed and updated.   Current Outpatient Medications:  .  CALCIUM PO, Take 1 tablet by mouth daily., Disp: , Rfl:  .  MAGNESIUM PO, Take 1 tablet by mouth daily., Disp: , Rfl:  .  Omega-3 Fatty Acids (FISH OIL PO), Take 1 tablet by mouth daily., Disp: , Rfl:  .  oxyCODONE-Acetaminophen (PERCOCET PO), Take by mouth as needed., Disp: , Rfl:  .  Multiple Vitamin  (MULTIVITAMIN WITH MINERALS) TABS tablet, Take 1 tablet by mouth daily., Disp: , Rfl:    Review of Systems  Constitutional: Positive for appetite change, chills, fatigue and unexpected weight change. Negative for diaphoresis and fever.  HENT: Positive for dental problem and trouble swallowing. Negative for congestion, drooling, ear pain, facial swelling, hearing loss, mouth sores, sneezing, sore throat and voice change.   Eyes: Positive for visual disturbance. Negative for pain, discharge, redness and itching.  Respiratory: Negative for cough, choking, shortness of breath and wheezing.   Cardiovascular: Positive for chest pain and leg swelling. Negative for palpitations.  Gastrointestinal: Negative for abdominal pain, blood in stool, constipation, diarrhea and vomiting.  Endocrine: Positive for cold intolerance and heat intolerance. Negative for polydipsia.  Genitourinary: Positive for decreased urine volume. Negative for dysuria and hematuria.  Musculoskeletal: Positive for arthralgias, back pain and gait problem.  Skin: Negative for rash.  Allergic/Immunologic: Negative for environmental allergies.  Neurological: Negative for seizures, syncope, light-headedness and headaches.  Hematological: Negative for adenopathy.  Psychiatric/Behavioral: Positive for agitation and dysphoric mood. Negative for suicidal ideas. The patient is nervous/anxious.     Per HPI unless specifically indicated above     Objective:    BP 120/60 (BP Location: Left Arm, Patient  Position: Sitting, Cuff Size: Normal)   Pulse (!) 104   Temp 97.9 F (36.6 C)   Ht 5' 9.25" (1.759 m)   Wt 140 lb 4 oz (63.6 kg)   SpO2 98%   BMI 20.56 kg/m   Wt Readings from Last 3 Encounters:  07/10/18 140 lb 4 oz (63.6 kg)  06/24/18 130 lb (59 kg)  04/30/18 129 lb 12.8 oz (58.9 kg)    Physical Exam  Constitutional: He is oriented to person, place, and time.  Pt with poor hygiene and body odor  HENT:  Head: Normocephalic and  atraumatic.  Mouth/Throat: Oropharynx is clear and moist. No oropharyngeal exudate.  Eyes: Pupils are equal, round, and reactive to light. Conjunctivae and EOM are normal.  Neck: Neck supple. No thyromegaly present.  Cardiovascular: Normal rate and regular rhythm.  Pulmonary/Chest: Effort normal and breath sounds normal. He has no wheezes. He has no rales.  Abdominal: Soft. Bowel sounds are normal. He exhibits no mass. There is no hepatosplenomegaly. There is no tenderness.  Genitourinary: Right testis shows tenderness. Right testis shows no mass and no swelling. Left testis shows no mass, no swelling and no tenderness. Circumcised.  Genitourinary Comments: (nurse Berenice chaperone) R testicular tenderness..  Unable to palpate mass or hernia.  No swelling appreciated  Musculoskeletal: He exhibits edema (R foot with mild swelling that does not extend to ankle).  Lymphadenopathy:    He has no cervical adenopathy.  Neurological: He is alert and oriented to person, place, and time.  Skin: Skin is warm and dry. No rash noted.  Psychiatric: He has a normal mood and affect. His behavior is normal. Thought content normal.  Vitals reviewed.   Results for orders placed or performed during the hospital encounter of 06/24/18  CBC  Result Value Ref Range   WBC 7.4 4.0 - 10.5 K/uL   RBC 3.55 (L) 4.22 - 5.81 MIL/uL   Hemoglobin 11.7 (L) 13.0 - 17.0 g/dL   HCT 16.1 (L) 09.6 - 04.5 %   MCV 98.6 78.0 - 100.0 fL   MCH 33.0 26.0 - 34.0 pg   MCHC 33.4 30.0 - 36.0 g/dL   RDW 40.9 81.1 - 91.4 %   Platelets 145 (L) 150 - 400 K/uL  Lipase, blood  Result Value Ref Range   Lipase 36 11 - 51 U/L  Comprehensive metabolic panel  Result Value Ref Range   Sodium 136 135 - 145 mmol/L   Potassium 4.6 3.5 - 5.1 mmol/L   Chloride 103 98 - 111 mmol/L   CO2 25 22 - 32 mmol/L   Glucose, Bld 98 70 - 99 mg/dL   BUN 5 (L) 6 - 20 mg/dL   Creatinine, Ser 7.82 0.61 - 1.24 mg/dL   Calcium 9.2 8.9 - 95.6 mg/dL   Total  Protein 6.3 (L) 6.5 - 8.1 g/dL   Albumin 2.8 (L) 3.5 - 5.0 g/dL   AST 46 (H) 15 - 41 U/L   ALT 19 0 - 44 U/L   Alkaline Phosphatase 100 38 - 126 U/L   Total Bilirubin 2.5 (H) 0.3 - 1.2 mg/dL   GFR calc non Af Amer >60 >60 mL/min   GFR calc Af Amer >60 >60 mL/min   Anion gap 8 5 - 15  Urinalysis, Routine w reflex microscopic  Result Value Ref Range   Color, Urine STRAW (A) YELLOW   APPearance CLEAR CLEAR   Specific Gravity, Urine 1.002 (L) 1.005 - 1.030   pH 7.0 5.0 -  8.0   Glucose, UA NEGATIVE NEGATIVE mg/dL   Hgb urine dipstick NEGATIVE NEGATIVE   Bilirubin Urine NEGATIVE NEGATIVE   Ketones, ur NEGATIVE NEGATIVE mg/dL   Protein, ur NEGATIVE NEGATIVE mg/dL   Nitrite NEGATIVE NEGATIVE   Leukocytes, UA NEGATIVE NEGATIVE  Protime-INR  Result Value Ref Range   Prothrombin Time 17.6 (H) 11.4 - 15.2 seconds   INR 1.46   I-stat troponin, ED  Result Value Ref Range   Troponin i, poc 0.00 0.00 - 0.08 ng/mL   Comment 3          POC occult blood, ED Provider will collect  Result Value Ref Range   Fecal Occult Bld NEGATIVE NEGATIVE      Assessment & Plan:   Encounter Diagnoses  Name Primary?  . Encounter to establish care Yes  . Tobacco use disorder   . Alcohol abuse, in remission   . Gastroesophageal reflux disease, esophagitis presence not specified   . Alcoholic hepatitis, unspecified whether ascites present   . Right testicular pain   . Protein-calorie malnutrition, unspecified severity (HCC)      -Pt declines counseling/MH treatment.  -rx omeprazole and counseled on lifestyle changes to help GERD -pt was given [facility] care application -refer to GI for liver -pt counseled to avoid alcohol.  Counseled smoking cessation -counseled pt on nutrition, particularly with need for protein.  Discussed this is likely cause of foot swelling -will refer to urology in light of persistent testicular pain -will not get any labs at this time since he had those done earlier this  month -pt to follow up here in 6 weeks to recheck GERD.  RTO sooner prn worsening or new symptoms

## 2018-07-10 NOTE — Patient Instructions (Addendum)
Gastroesophageal Reflux Disease, Adult Normally, food travels down the esophagus and stays in the stomach to be digested. However, when a person has gastroesophageal reflux disease (GERD), food and stomach acid move back up into the esophagus. When this happens, the esophagus becomes sore and inflamed. Over time, GERD can create small holes (ulcers) in the lining of the esophagus. What are the causes? This condition is caused by a problem with the muscle between the esophagus and the stomach (lower esophageal sphincter, or LES). Normally, the LES muscle closes after food passes through the esophagus to the stomach. When the LES is weakened or abnormal, it does not close properly, and that allows food and stomach acid to go back up into the esophagus. The LES can be weakened by certain dietary substances, medicines, and medical conditions, including:  Tobacco use.  Pregnancy.  Having a hiatal hernia.  Heavy alcohol use.  Certain foods and beverages, such as coffee, chocolate, onions, and peppermint.  What increases the risk? This condition is more likely to develop in:  People who have an increased body weight.  People who have connective tissue disorders.  People who use NSAID medicines.  What are the signs or symptoms? Symptoms of this condition include:  Heartburn.  Difficult or painful swallowing.  The feeling of having a lump in the throat.  Abitter taste in the mouth.  Bad breath.  Having a large amount of saliva.  Having an upset or bloated stomach.  Belching.  Chest pain.  Shortness of breath or wheezing.  Ongoing (chronic) cough or a night-time cough.  Wearing away of tooth enamel.  Weight loss.  Different conditions can cause chest pain. Make sure to see your health care provider if you experience chest pain. How is this diagnosed? Your health care provider will take a medical history and perform a physical exam. To determine if you have mild or severe  GERD, your health care provider may also monitor how you respond to treatment. You may also have other tests, including:  An endoscopy toexamine your stomach and esophagus with a small camera.  A test thatmeasures the acidity level in your esophagus.  A test thatmeasures how much pressure is on your esophagus.  A barium swallow or modified barium swallow to show the shape, size, and functioning of your esophagus.  How is this treated? The goal of treatment is to help relieve your symptoms and to prevent complications. Treatment for this condition may vary depending on how severe your symptoms are. Your health care provider may recommend:  Changes to your diet.  Medicine.  Surgery.  Follow these instructions at home: Diet  Follow a diet as recommended by your health care provider. This may involve avoiding foods and drinks such as: ? Coffee and tea (with or without caffeine). ? Drinks that containalcohol. ? Energy drinks and sports drinks. ? Carbonated drinks or sodas. ? Chocolate and cocoa. ? Peppermint and mint flavorings. ? Garlic and onions. ? Horseradish. ? Spicy and acidic foods, including peppers, chili powder, curry powder, vinegar, hot sauces, and barbecue sauce. ? Citrus fruit juices and citrus fruits, such as oranges, lemons, and limes. ? Tomato-based foods, such as red sauce, chili, salsa, and pizza with red sauce. ? Fried and fatty foods, such as donuts, french fries, potato chips, and high-fat dressings. ? High-fat meats, such as hot dogs and fatty cuts of red and white meats, such as rib eye steak, sausage, ham, and bacon. ? High-fat dairy items, such as whole milk,   butter, and cream cheese.  Eat small, frequent meals instead of large meals.  Avoid drinking large amounts of liquid with your meals.  Avoid eating meals during the 2-3 hours before bedtime.  Avoid lying down right after you eat.  Do not exercise right after you eat. General  instructions  Pay attention to any changes in your symptoms.  Take over-the-counter and prescription medicines only as told by your health care provider. Do not take aspirin, ibuprofen, or other NSAIDs unless your health care provider told you to do so.  Do not use any tobacco products, including cigarettes, chewing tobacco, and e-cigarettes. If you need help quitting, ask your health care provider.  Wear loose-fitting clothing. Do not wear anything tight around your waist that causes pressure on your abdomen.  Raise (elevate) the head of your bed 6 inches (15cm).  Try to reduce your stress, such as with yoga or meditation. If you need help reducing stress, ask your health care provider.  If you are overweight, reduce your weight to an amount that is healthy for you. Ask your health care provider for guidance about a safe weight loss goal.  Keep all follow-up visits as told by your health care provider. This is important. Contact a health care provider if:  You have new symptoms.  You have unexplained weight loss.  You have difficulty swallowing, or it hurts to swallow.  You have wheezing or a persistent cough.  Your symptoms do not improve with treatment.  You have a hoarse voice. Get help right away if:  You have pain in your arms, neck, jaw, teeth, or back.  You feel sweaty, dizzy, or light-headed.  You have chest pain or shortness of breath.  You vomit and your vomit looks like blood or coffee grounds.  You faint.  Your stool is bloody or black.  You cannot swallow, drink, or eat. This information is not intended to replace advice given to you by your health care provider. Make sure you discuss any questions you have with your health care provider. Document Released: 06/15/2005 Document Revised: 02/03/2016 Document Reviewed: 12/31/2014 Elsevier Interactive Patient Education  2018 [employer].  -------------------------------------------------------------------------   Eating Healthy on a Budget There are many ways to save money at the grocery store and continue to eat healthy. You can be successful if you plan your meals according to your budget, purchase according to your budget and grocery list, and prepare food yourself. How can I buy more food on a limited budget? Plan  Plan meals and snacks according to a grocery list and budget you create.  Look for recipes where you can cook once and make enough food for two meals.  Include meals that will "stretch" more expensive foods such as stews, casseroles, and stir-fry dishes.  Make a grocery list and make sure to bring it with you to the store. If you have a smart phone, you could use your phone to create your shopping list. Purchase  When grocery shopping, buy only the items on your grocery list and go only to the areas of the store that have the items on your list. Prepare  Some meal items can be prepared in advance. Pre-cook on days when you have extra time.  Make extra food (such as by doubling recipes) and freeze the extras in meal-sized containers or in individual portions for fast meals and snacks.  Use leftovers in your meal plan for the week.  Try some meatless meals or try "no cook"  meals like salads.  When you come home from the grocery store, wash and prepare your fruits and vegetables so they are ready to use and eat. This will help reduce food waste. How can I buy more food on a limited budget? Try these tips the next time you go shopping:  [place] store brands or generic brands.  Use coupons only for foods and brands you normally buy. Avoid buying items you wouldn't normally buy simply because they are on sale.  Check online and in newspapers for weekly deals.  Buy healthy items from the bulk bins when available, such as herbs, spices, flours, pastas, nuts, and dried fruit.  Buy fruits and  vegetables that are in season. Prices are usually lower on in-season produce.  Compare and contrast different items. You can do this by looking at the unit price on the price tag. Use it to compare different brands and sizes to find out which item is the best deal.  Choose naturally low-cost healthy items, such as carrots, potatoes, apples, bananas, and oranges. Dried or canned beans are a low-cost protein source.  Buy in bulk and freeze extra food. Items you can buy in bulk include meats, fish, poultry, frozen fruits, and frozen vegetables.  Limit the purchase of prepared or "ready-to-eat" foods, such as pre-cut fruits and vegetables and pre-made salads.  If possible, shop around to discover which grocery store offers the best prices. Some stores charge much more than other stores for the same items.  Do not shop when you are hungry. If you shop while hungry, It may be hard to stick to your list and budget.  Stick to your list and resist impulse buys. Treat your list as your official plan for the week.  Buy a variety of vegetables and fruit by purchasing fresh, frozen, and canned items.  Look beyond eye level. Foods at eye level (adult or child eye level) are more expensive. Look at the top and bottom shelves for deals.  Be efficient with your time when shopping. The more time you spend at the store, the more money you are likely to spend.  Consider other retailers such as dollar stores, larger [employer], local fruit and vegetable stands, and farmers markets.  What are some tips for less expensive food substitutions? When choosing more expensive foods like meats and dairy, try these tips to save money:  Choose cheaper cuts of meat, such as bone-in chicken thighs and drumsticks instead skinless and boneless chicken. When you are ready to prepare the chicken, you can remove the skin yourself to make it healthier.  Choose lean meats like chicken or [place]. When choosing ground beef,  make sure it is lean ground beef (92% lean, 8% fat). If you do buy a fattier ground beef, drain the fat before eating.  Buy dried beans and peas, such as lentils, split peas, or kidney beans.  For seafood, choose canned tuna, salmon, or sardines.  Eggs are a low-cost source of protein.  Buy the larger tubs of yogurt instead of individual-sized containers.  Choose water instead of sodas and other sweetened beverages.  Skip buying chips, cookies, and other "junk food". These items are usually expensive, high in calories, and low in nutritional value.  How can I prepare the foods I buy in the healthiest way? Practice these tips for cooking foods in the healthiest way to reduce excess fat and calorie intake:  Steam, saute, grill, or bake foods instead of frying them.  Make sure half  your plate is filled with fruits or vegetables. Choose from fresh, frozen, or canned fruits and vegetables. If eating canned, remember to rinse them before eating. This will remove any excess salt added for packaging.  Trim all fat from meat before cooking. Remove the skin from chicken or [place].  Spoon off fat from meat dishes once they have been chilled in the refrigerator and the fat has hardened on the top.  Use skim milk, low-fat milk, or evaporated skim milk when making cream sauces, soups, or puddings.  Substitute low-fat yogurt, sour cream, or cottage cheese for sour cream and mayonnaise in dips and dressings.  Try lemon juice, herbs, or spices to season food instead of salt, butter, or margarine.  This information is not intended to replace advice given to you by your health care provider. Make sure you discuss any questions you have with your health care provider. Document Released: 05/09/2014 Document Revised: 03/25/2016 Document Reviewed: 04/08/2014 Elsevier Interactive Patient Education  [employer].

## 2018-07-11 ENCOUNTER — Encounter: Payer: Self-pay | Admitting: Internal Medicine

## 2018-08-21 ENCOUNTER — Encounter: Payer: Self-pay | Admitting: Physician Assistant

## 2018-08-21 ENCOUNTER — Ambulatory Visit: Payer: Self-pay | Admitting: Physician Assistant

## 2018-08-21 VITALS — BP 120/56 | HR 89 | Temp 99.7°F | Ht 69.25 in | Wt 146.5 lb

## 2018-08-21 DIAGNOSIS — K7010 Alcoholic hepatitis without ascites: Secondary | ICD-10-CM

## 2018-08-21 DIAGNOSIS — K219 Gastro-esophageal reflux disease without esophagitis: Secondary | ICD-10-CM

## 2018-08-21 DIAGNOSIS — F17200 Nicotine dependence, unspecified, uncomplicated: Secondary | ICD-10-CM

## 2018-08-21 DIAGNOSIS — F1011 Alcohol abuse, in remission: Secondary | ICD-10-CM

## 2018-08-21 DIAGNOSIS — N50811 Right testicular pain: Secondary | ICD-10-CM

## 2018-08-21 NOTE — Progress Notes (Signed)
BP (!) 120/56 (BP Location: Left Arm, Patient Position: Sitting, Cuff Size: Normal)   Pulse 89   Temp 99.7 F (37.6 C)   Ht 5' 9.25" (1.759 m)   Wt 146 lb 8 oz (66.5 kg)   SpO2 99%   BMI 21.48 kg/m    Subjective:    Patient ID: Tom Herman, male    DOB: [birth year], 37 y.o.   MRN: [id number]  HPI: Tom Herman is a 37 y.o. male presenting on 08/21/2018 for Follow-up   HPI   Pt started on omeprazole and lifestyle changes at last OV.  Pt was also counseled to avoid etoh  Pt was referred to urology- he has appointment january 10  Pt was referred to GI- he has appointment on december 24  Pt says the omeprazole isn't helping any.   He says he is not drinking any longer but is thinking about starting back again due to "stressed out".  Pt doesn't want to go to counseling  Pt has not yet turned in his application for [facility]   Relevant past medical, surgical, family and social history reviewed and updated as indicated. Interim medical history since our last visit reviewed. Allergies and medications reviewed and updated.   Current Outpatient Medications:  Marland Kitchen  MAGNESIUM PO, Take 1 tablet by mouth daily., Disp: , Rfl:  .  Multiple Vitamin (MULTIVITAMIN WITH MINERALS) TABS tablet, Take 1 tablet by mouth daily., Disp: , Rfl:  .  Omega-3 Fatty Acids (FISH OIL PO), Take 1 tablet by mouth daily., Disp: , Rfl:  .  omeprazole (PRILOSEC) 40 MG capsule, Take 1 capsule (40 mg total) by mouth daily., Disp: 30 capsule, Rfl: 1 .  oxyCODONE-Acetaminophen (PERCOCET PO), Take by mouth as needed., Disp: , Rfl:  .  CALCIUM PO, Take 1 tablet by mouth daily., Disp: , Rfl:    Review of Systems  Per HPI unless specifically indicated above     Objective:    BP (!) 120/56 (BP Location: Left Arm, Patient Position: Sitting, Cuff Size: Normal)   Pulse 89   Temp 99.7 F (37.6 C)   Ht 5' 9.25" (1.759 m)   Wt 146 lb 8 oz (66.5 kg)   SpO2 99%   BMI 21.48 kg/m   Wt Readings from  Last 3 Encounters:  08/21/18 146 lb 8 oz (66.5 kg)  07/10/18 140 lb 4 oz (63.6 kg)  06/24/18 130 lb (59 kg)    Physical Exam  Constitutional: He is oriented to person, place, and time. He appears well-developed and well-nourished.  HENT:  Head: Normocephalic and atraumatic.  Neck: Neck supple.  Cardiovascular: Normal rate and regular rhythm.  Pulmonary/Chest: Effort normal and breath sounds normal. He has no wheezes.  Abdominal: Soft. Bowel sounds are normal. There is no hepatosplenomegaly. There is generalized tenderness. There is no rigidity, no rebound and no guarding.  Musculoskeletal: He exhibits no edema.  Lymphadenopathy:    He has no cervical adenopathy.  Neurological: He is alert and oriented to person, place, and time.  Skin: Skin is warm and dry.  Psychiatric: He has a normal mood and affect. His behavior is normal.  Vitals reviewed.       Assessment & Plan:   Encounter Diagnoses  Name Primary?  . Alcoholic hepatitis, unspecified whether ascites present Yes  . Alcohol abuse, in remission   . Tobacco use disorder   . Gastroesophageal reflux disease, esophagitis presence not specified   . Right testicular pain      -  pt to Go to specialist appointments as scheduled -pt to [facility] in [facility] -pt to Continue omeprazole -encouraged pt to continue to Abstain from etoh -pt to Follow up here 3 months.  RTO sooner prn

## 2018-08-21 NOTE — Patient Instructions (Signed)
Financial Counselor- [phone] Call about [facility] 

## 2018-09-11 ENCOUNTER — Encounter: Payer: Self-pay | Admitting: *Deleted

## 2018-09-11 ENCOUNTER — Ambulatory Visit (INDEPENDENT_AMBULATORY_CARE_PROVIDER_SITE_OTHER): Payer: Self-pay | Admitting: Gastroenterology

## 2018-09-11 ENCOUNTER — Encounter: Payer: Self-pay | Admitting: Gastroenterology

## 2018-09-11 VITALS — BP 135/75 | HR 98 | Temp 98.8°F | Ht 70.0 in | Wt 147.2 lb

## 2018-09-11 DIAGNOSIS — R1011 Right upper quadrant pain: Secondary | ICD-10-CM

## 2018-09-11 DIAGNOSIS — K7010 Alcoholic hepatitis without ascites: Secondary | ICD-10-CM

## 2018-09-11 DIAGNOSIS — D649 Anemia, unspecified: Secondary | ICD-10-CM | POA: Insufficient documentation

## 2018-09-11 NOTE — Assessment & Plan Note (Signed)
Mild anemia noted in 04/2018 with macrocytosis in setting of etoh abuse. H/H still low in 06/2018 but stable. Rectal bleeding resolved couple of months ago and no melena. Suspect nutrition related anemia. Follow up on labs. Patient still declines colonoscopy as outlined above.

## 2018-09-11 NOTE — Assessment & Plan Note (Signed)
Presented in 04/2018 with alcohol hepatitis and testicular swelling. Stopped drinking in 04/2018. Labs improved in 06/2018. Imaging in 04/2018 with prominent fatty liver. Patient continues to have ruq pain, not related to meals, mostly positional which could be due to hepatomegaly.   Update labs and ruq u/s.   Congratulated him on his sobriety. Discussed that it is imperative for him to avoid all etoh if he desires chance for his liver to recover. Offered resources for [employer], etc and he currently is not interested.   Follow up recommendations to come after labs and ultrasound results.

## 2018-09-11 NOTE — Patient Instructions (Signed)
Please have your labs done at [facility] the day you go for ultrasound of your liver.   Continue to abstain from alcohol use to give yourself the best shot for complete recovery. If you need any assistance or have questions, please call us.
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
## 2018-09-11 NOTE — Progress Notes (Signed)
CC'D TO PCP °

## 2018-09-18 ENCOUNTER — Other Ambulatory Visit (HOSPITAL_COMMUNITY)
Admission: RE | Admit: 2018-09-18 | Discharge: 2018-09-18 | Disposition: A | Discharge status: Home / Self Care | Payer: Self-pay | Source: Ambulatory Visit | Attending: Gastroenterology | Admitting: Gastroenterology

## 2018-09-18 ENCOUNTER — Ambulatory Visit (HOSPITAL_COMMUNITY)
Admission: RE | Admit: 2018-09-18 | Discharge: 2018-09-18 | Disposition: A | Discharge status: Home / Self Care | Payer: Self-pay | Source: Ambulatory Visit | Attending: Gastroenterology | Admitting: Gastroenterology

## 2018-09-18 DIAGNOSIS — D649 Anemia, unspecified: Secondary | ICD-10-CM | POA: Insufficient documentation

## 2018-09-18 DIAGNOSIS — R1011 Right upper quadrant pain: Secondary | ICD-10-CM | POA: Insufficient documentation

## 2018-09-18 DIAGNOSIS — K7010 Alcoholic hepatitis without ascites: Secondary | ICD-10-CM | POA: Insufficient documentation

## 2018-09-18 LAB — CBC WITH DIFFERENTIAL/PLATELET
Abs Immature Granulocytes: 0.01 10*3/uL (ref 0.00–0.07)
Basophils Absolute: 0 10*3/uL (ref 0.0–0.1)
Basophils Relative: 1 %
Eosinophils Absolute: 0.2 10*3/uL (ref 0.0–0.5)
Eosinophils Relative: 2 %
HCT: 38.1 % — ABNORMAL LOW (ref 39.0–52.0)
Hemoglobin: 12.8 g/dL — ABNORMAL LOW (ref 13.0–17.0)
Immature Granulocytes: 0 %
Lymphocytes Relative: 25 %
Lymphs Abs: 1.7 10*3/uL (ref 0.7–4.0)
MCH: 31.1 pg (ref 26.0–34.0)
MCHC: 33.6 g/dL (ref 30.0–36.0)
MCV: 92.7 fL (ref 80.0–100.0)
Monocytes Absolute: 0.8 10*3/uL (ref 0.1–1.0)
Monocytes Relative: 11 %
Neutro Abs: 4.3 10*3/uL (ref 1.7–7.7)
Neutrophils Relative %: 61 %
Platelets: 131 10*3/uL — ABNORMAL LOW (ref 150–400)
RBC: 4.11 MIL/uL — ABNORMAL LOW (ref 4.22–5.81)
RDW: 13.4 % (ref 11.5–15.5)
WBC: 7.1 10*3/uL (ref 4.0–10.5)
nRBC: 0 % (ref 0.0–0.2)

## 2018-09-18 LAB — COMPREHENSIVE METABOLIC PANEL
ALT: 16 U/L (ref 0–44)
AST: 23 U/L (ref 15–41)
Albumin: 4.2 g/dL (ref 3.5–5.0)
Alkaline Phosphatase: 86 U/L (ref 38–126)
Anion gap: 8 (ref 5–15)
BUN: 14 mg/dL (ref 6–20)
CO2: 24 mmol/L (ref 22–32)
Calcium: 9.6 mg/dL (ref 8.9–10.3)
Chloride: 104 mmol/L (ref 98–111)
Creatinine, Ser: 0.6 mg/dL — ABNORMAL LOW (ref 0.61–1.24)
GFR calc Af Amer: >60 mL/min (ref >60)
GFR calc non Af Amer: >60 mL/min (ref >60)
Glucose, Bld: 96 mg/dL (ref 70–99)
Potassium: 4.3 mmol/L (ref 3.5–5.1)
Sodium: 136 mmol/L (ref 135–145)
Total Bilirubin: 2.3 mg/dL — ABNORMAL HIGH (ref 0.3–1.2)
Total Protein: 7.6 g/dL (ref 6.5–8.1)

## 2018-09-18 LAB — PROTIME-INR
INR: 1.35
Prothrombin Time: 16.6 seconds — ABNORMAL HIGH (ref 11.4–15.2)

## 2018-09-24 ENCOUNTER — Other Ambulatory Visit: Payer: Self-pay

## 2018-09-24 DIAGNOSIS — D649 Anemia, unspecified: Secondary | ICD-10-CM

## 2018-09-25 IMAGING — US US ART/VEN ABD/PELV/SCROTUM DOPPLER LTD
1 series · 14 of 25 positions shown · non-contrast
Comparison: None.

CLINICAL DATA: 36-year-old male with RIGHT testicular pain for 2
weeks.

EXAM:
SCROTAL ULTRASOUND
DOPPLER ULTRASOUND OF THE TESTICLES
TECHNIQUE: Complete ultrasound examination of the testicles, epididymis, and
other scrotal structures was performed. Color and spectral Doppler
ultrasound were also utilized to evaluate blood flow to the
testicles.

[Series 1: us art/ven abd/pelv/scrotum doppler ltd · 0.06mm/px · 14 of 57 slices shown]
[im 1/57]
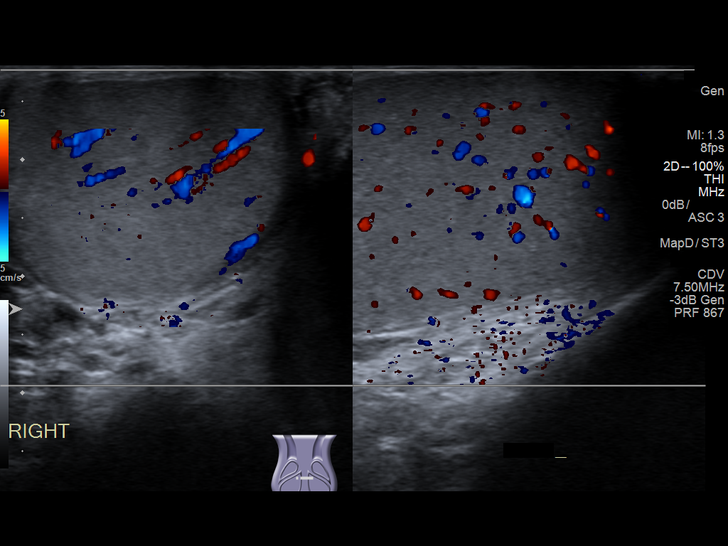
[im 5/57]
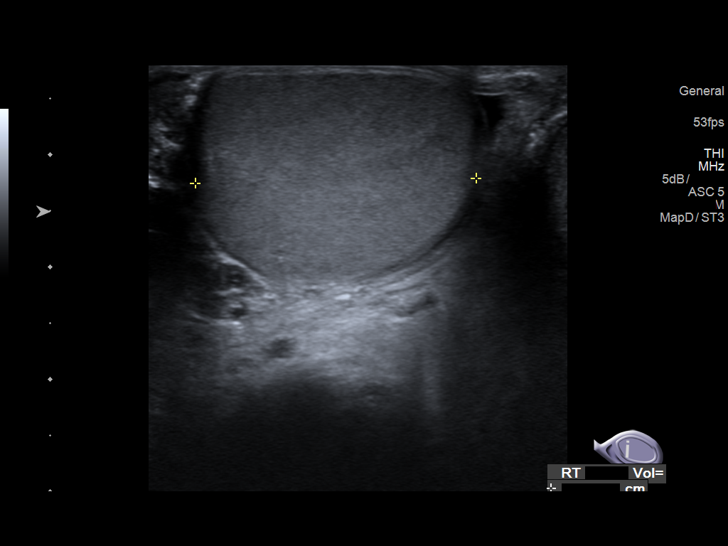
[im 10/57]
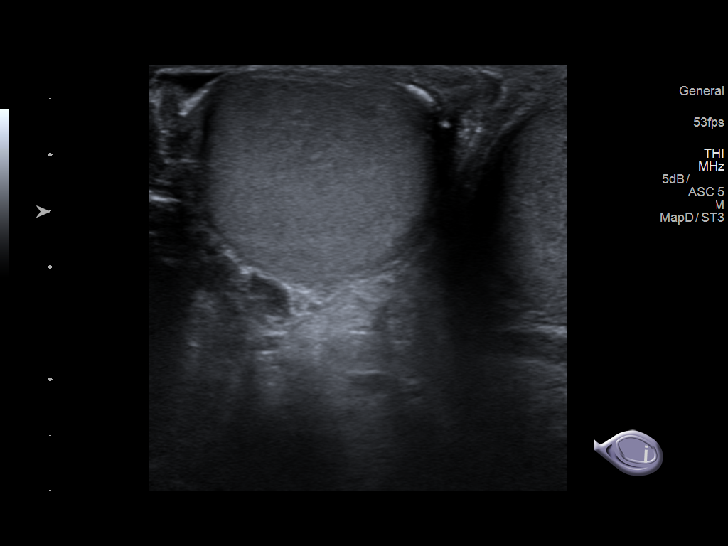
[im 15/57]
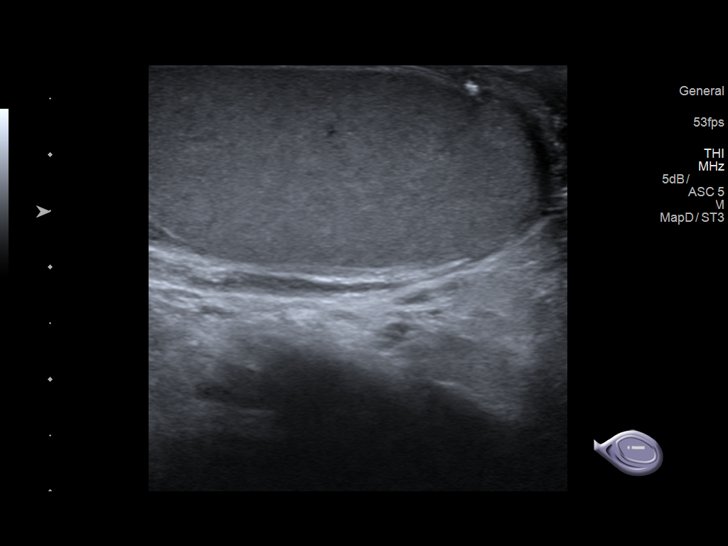
[im 19/57]
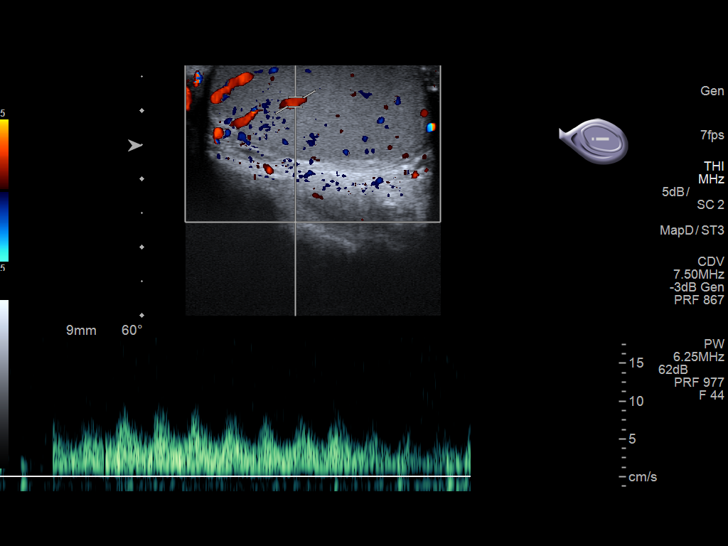
[im 22/57]
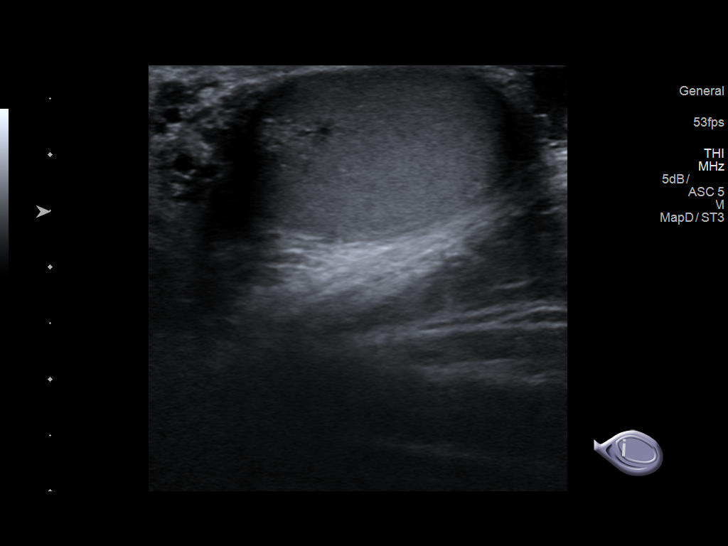
[im 26/57]
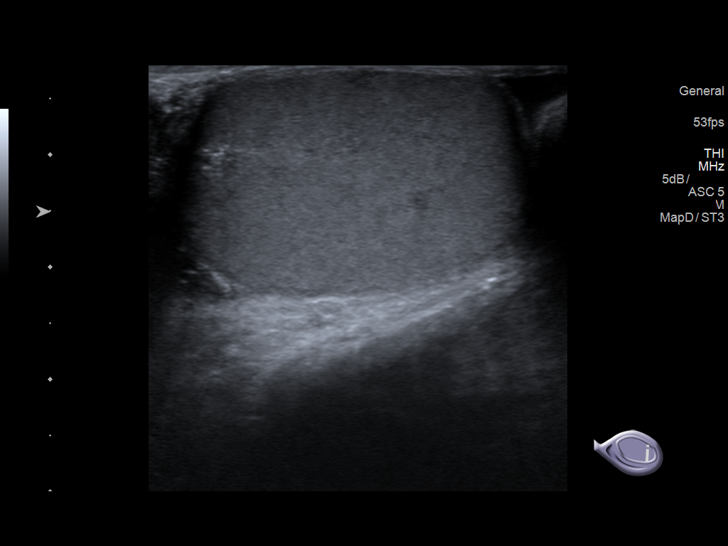
[im 31/57]
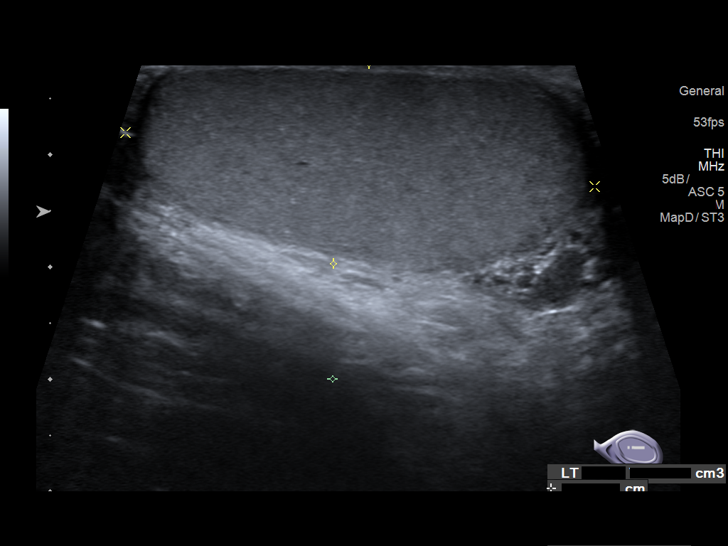
[im 36/57]
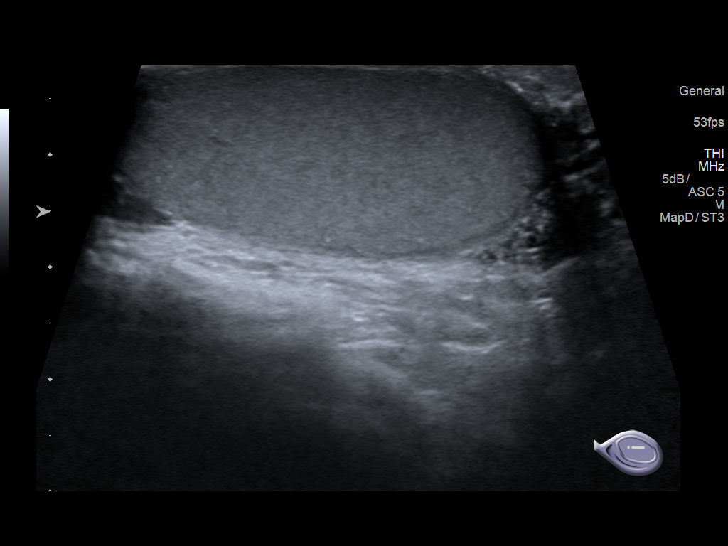
[im 38/57]
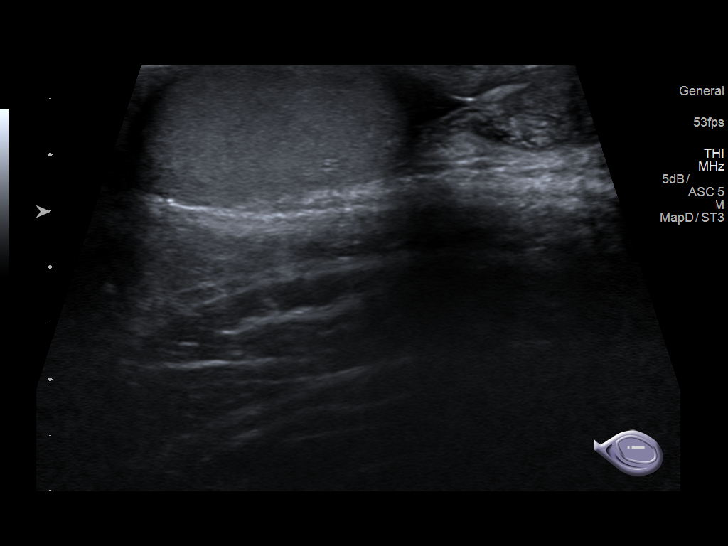
[im 43/57]
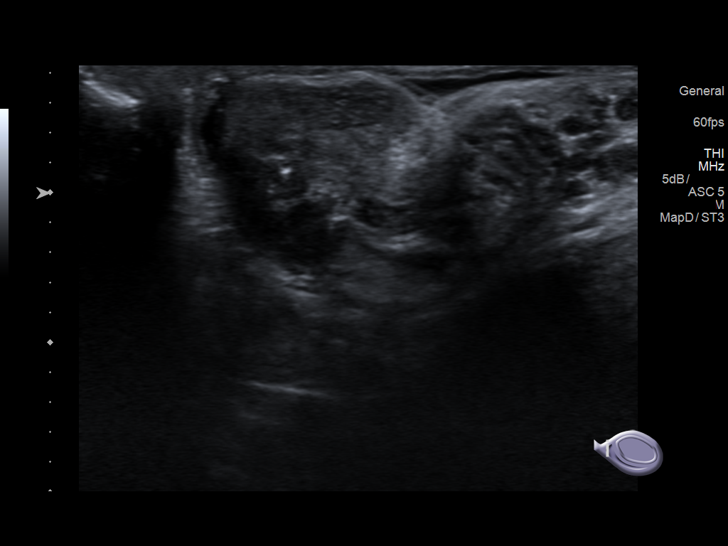
[im 47/57]
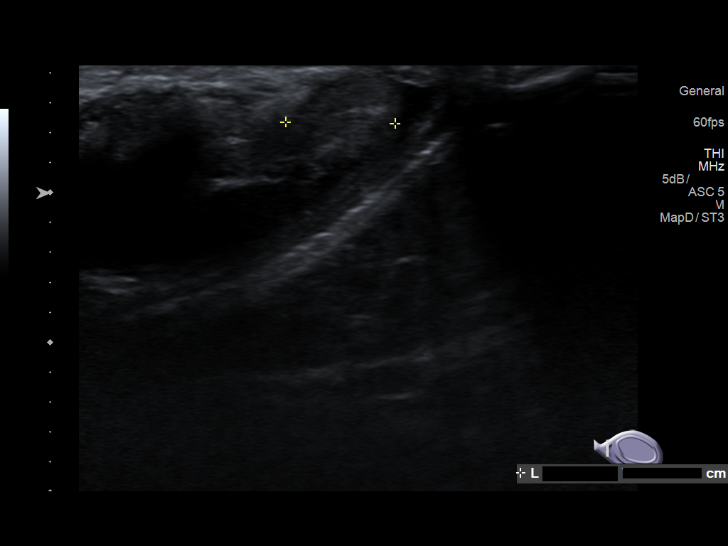
[im 52/57]
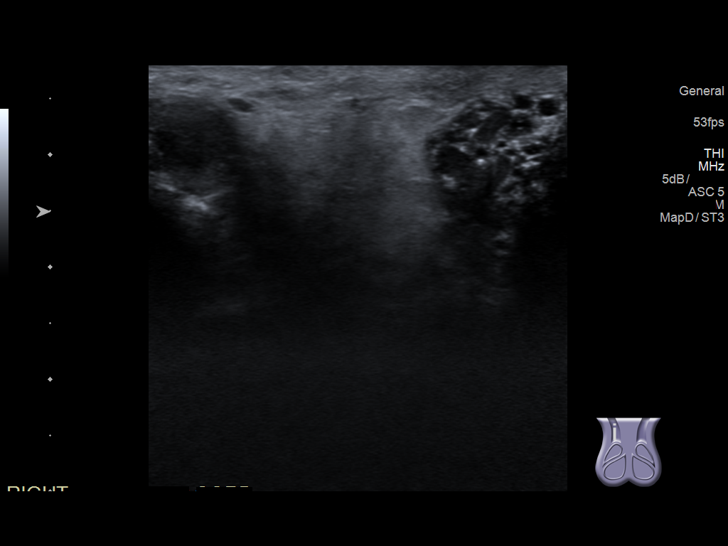
[im 57/57]
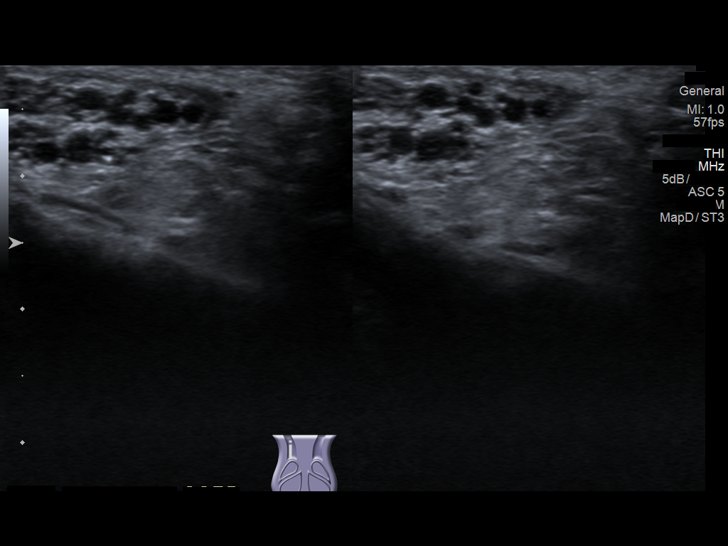

[14 of 25 positions shown; findings below may reference images not displayed]

FINDINGS: Right testicle

Measurements: 3.8 x 2.1 x 2.4 cm. No mass or microlithiasis
visualized.

Left testicle

Measurements: 4.2 x 1.8 x 2.8 cm. No mass or microlithiasis
visualized.

Right epididymis:  Normal in size and appearance.

Left epididymis:  Normal in size and appearance.

Hydrocele:  Very small bilateral hydroceles noted.

Varicocele:  None visualized.

Pulsed Doppler interrogation of both testes demonstrates normal low
resistance arterial and venous waveforms bilaterally.

Scrotal calcifications are chronic, compatible with prior infection
or inflammation.
IMPRESSION: 1. Normal testicles bilaterally. No evidence of testicular torsion
or mass.
2. Very small bilateral hydroceles.

## 2018-09-27 IMAGING — US US ABDOMEN LIMITED
1 series · 14 of 25 positions shown · non-contrast
Comparison: 04/30/2018

CLINICAL DATA: Bilirubinemia

EXAM:
ULTRASOUND ABDOMEN LIMITED RIGHT UPPER QUADRANT

[Series 1: us abdomen limited · 0.19mm/px · 14 of 77 slices shown]
[im 1/77]
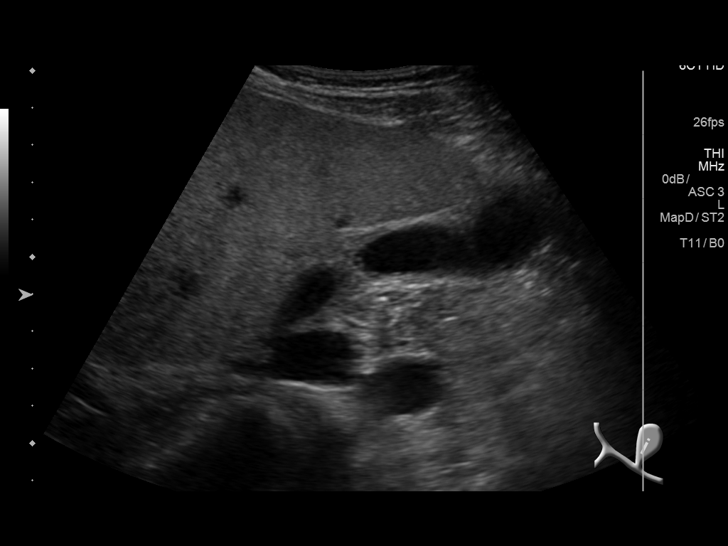
[im 7/77]
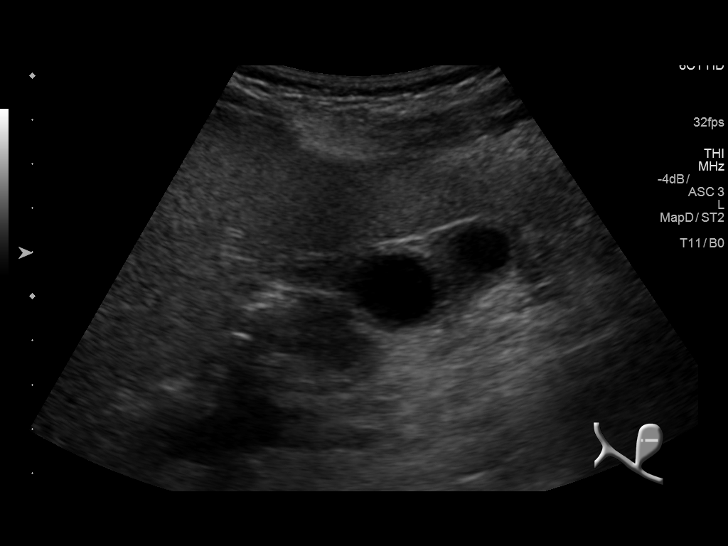
[im 13/77]
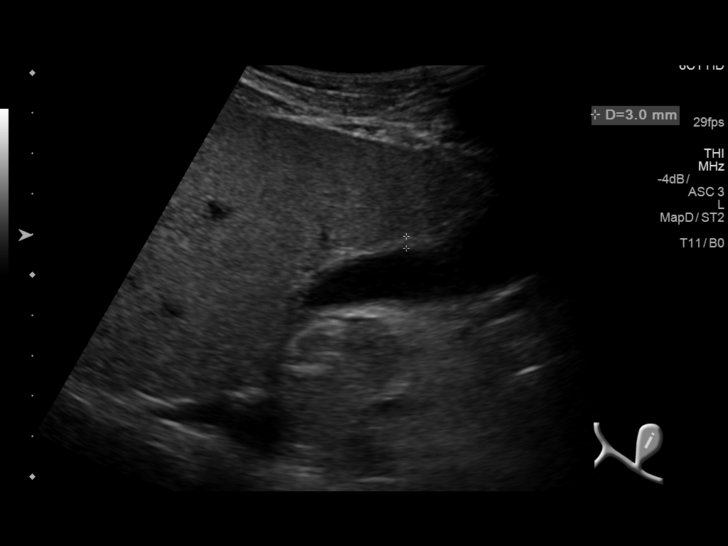
[im 20/77]
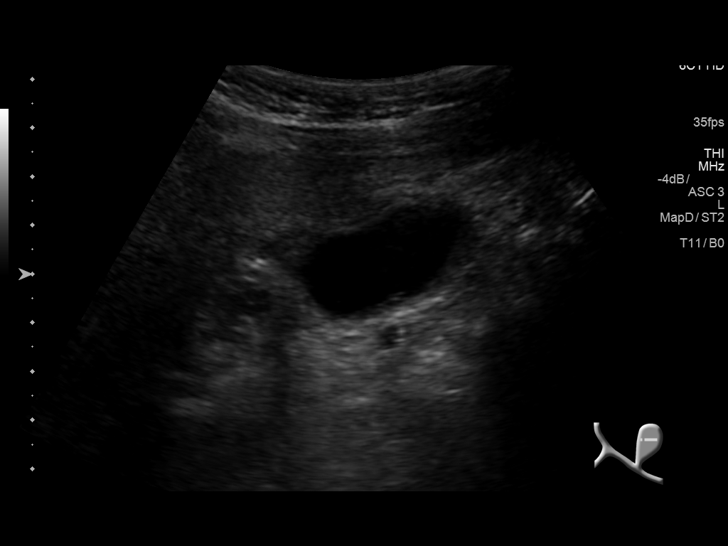
[im 26/77]
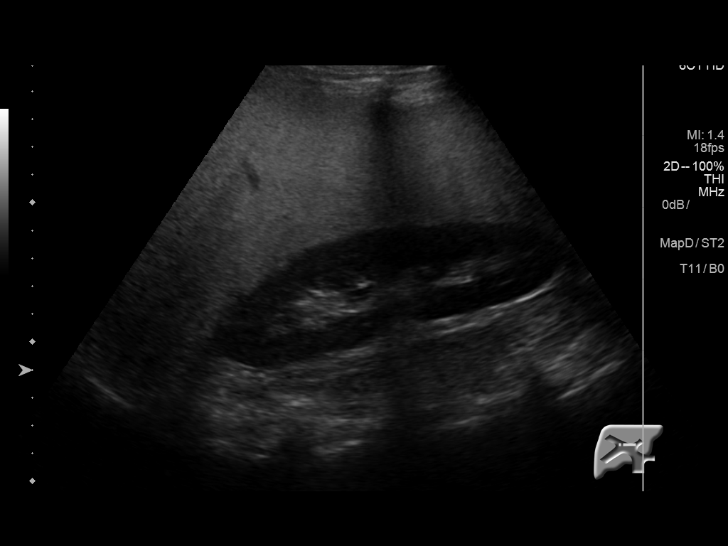
[im 29/77]
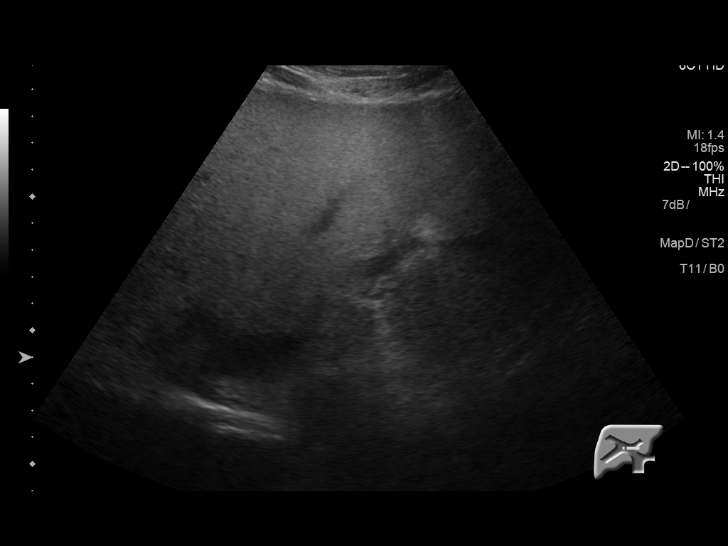
[im 35/77]
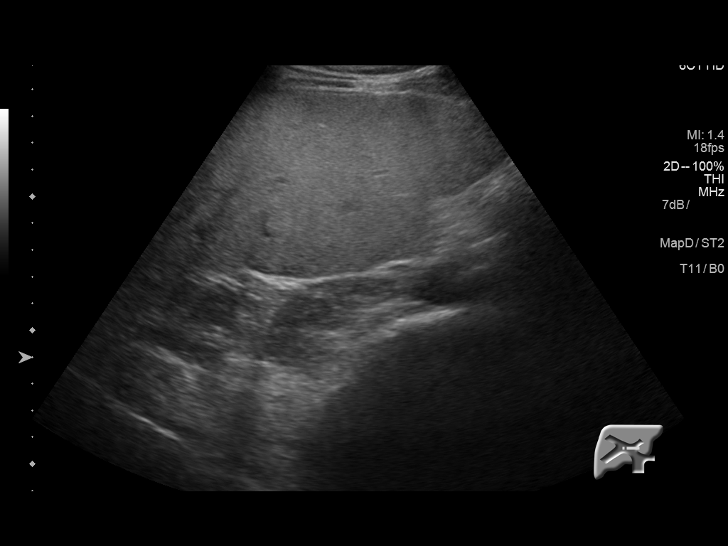
[im 42/77]
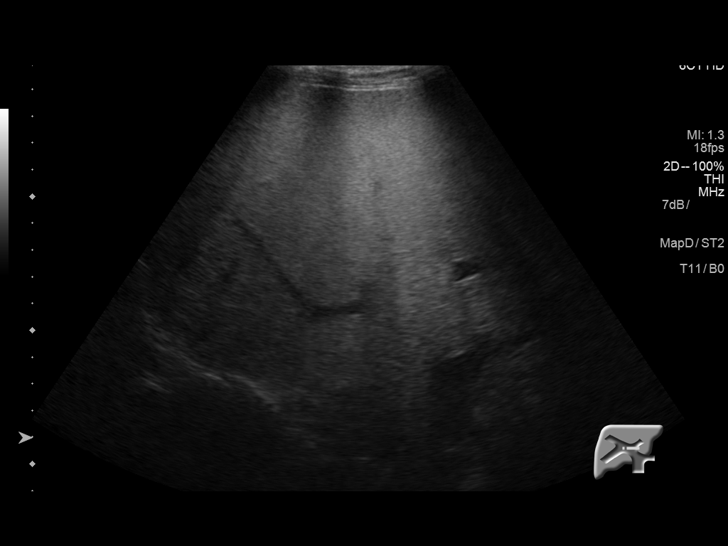
[im 48/77]
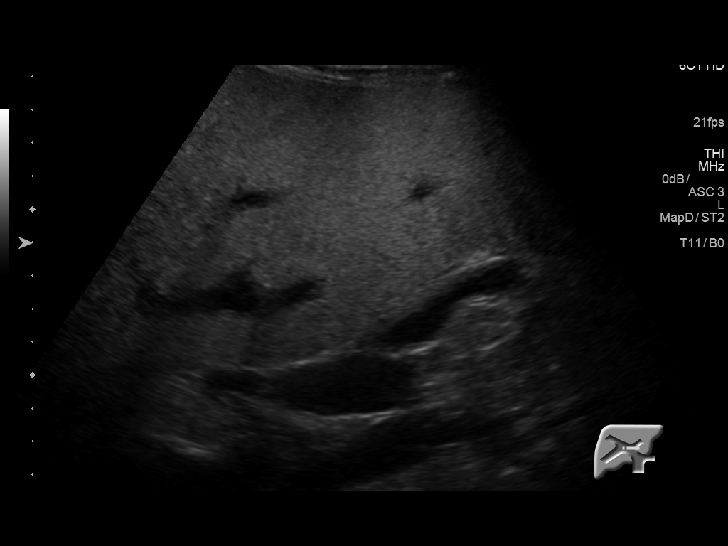
[im 51/77]
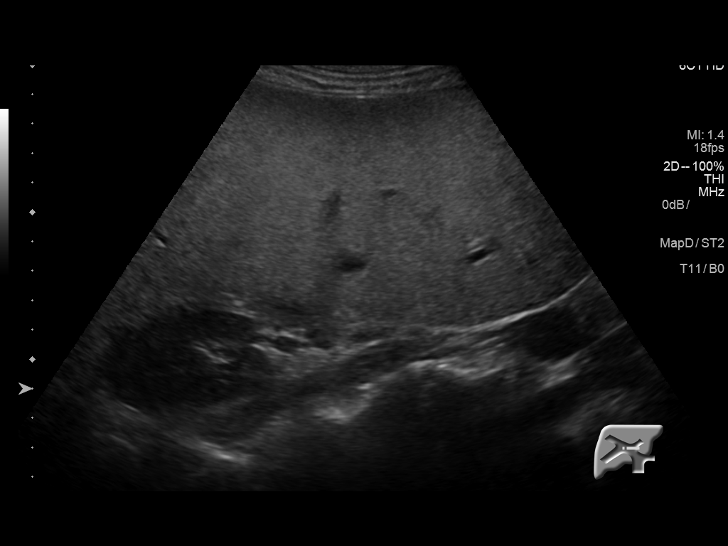
[im 58/77]
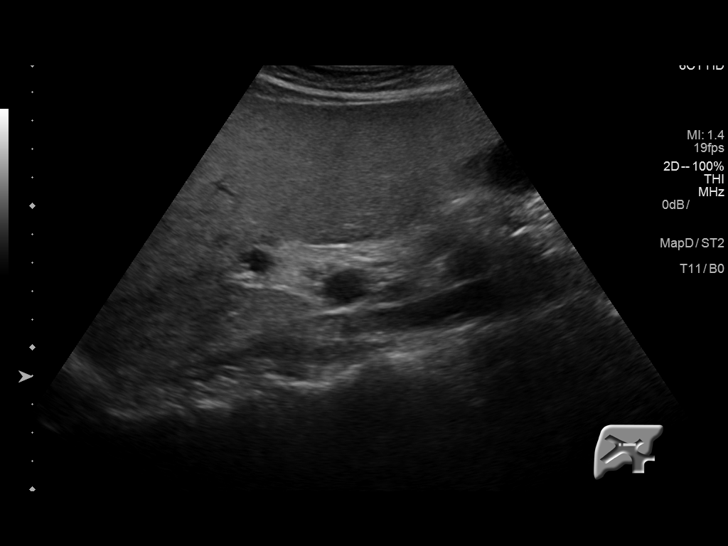
[im 64/77]
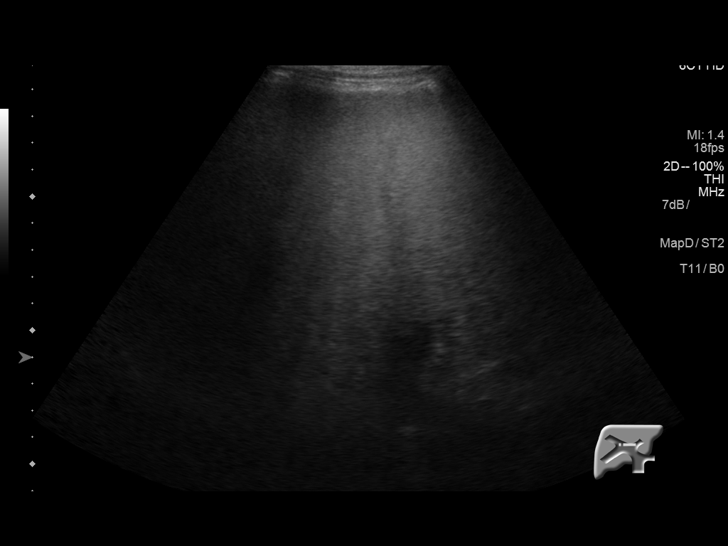
[im 70/77]
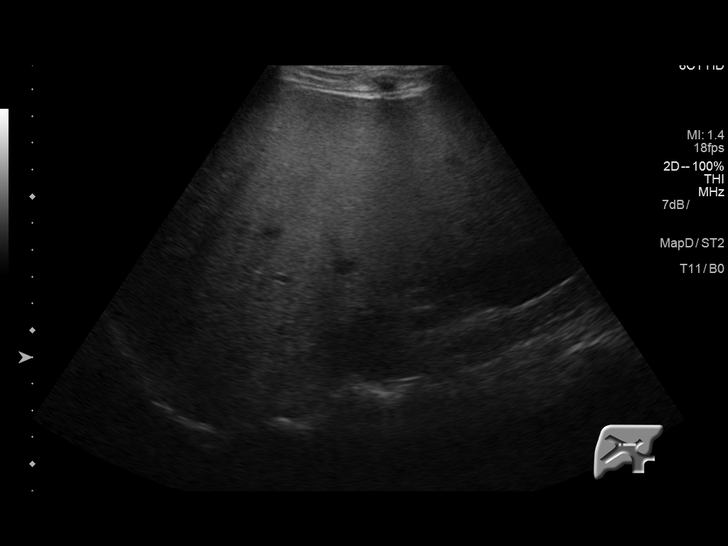
[im 77/77]
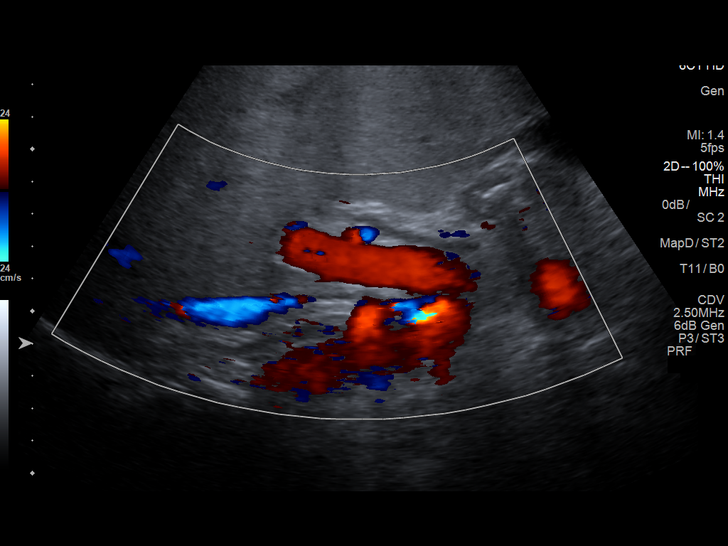

[14 of 25 positions shown; findings below may reference images not displayed]

FINDINGS: Gallbladder:

No gallstones or wall thickening visualized. No sonographic Murphy
sign noted by sonographer.

Common bile duct:

Diameter: Normal caliber, 2 mm

Liver:

Increased echotexture compatible with fatty infiltration. No focal
abnormality or biliary ductal dilatation. Liver appears prominent,
possible hepatomegaly. Portal vein is patent on color Doppler
imaging with normal direction of blood flow towards the liver.
IMPRESSION: No evidence of cholelithiasis or cholecystitis.

Fatty infiltration of the liver, likely hepatomegaly.

## 2018-09-28 ENCOUNTER — Ambulatory Visit (INDEPENDENT_AMBULATORY_CARE_PROVIDER_SITE_OTHER): Payer: Self-pay | Admitting: Urology

## 2018-09-28 DIAGNOSIS — N50811 Right testicular pain: Secondary | ICD-10-CM

## 2018-09-28 DIAGNOSIS — N4341 Spermatocele of epididymis, single: Secondary | ICD-10-CM

## 2018-10-06 ENCOUNTER — Other Ambulatory Visit: Payer: Self-pay | Admitting: Gastroenterology

## 2018-10-06 MED ORDER — OMEPRAZOLE 40 MG PO CPDR: 40.0000 mg | DELAYED_RELEASE_CAPSULE | Freq: Every day | ORAL | 3 refills | Status: DC

## 2018-11-20 ENCOUNTER — Ambulatory Visit: Payer: Self-pay | Admitting: Physician Assistant

## 2018-11-20 ENCOUNTER — Encounter: Payer: Self-pay | Admitting: Physician Assistant

## 2018-11-20 VITALS — BP 126/70 | HR 86 | Temp 98.2°F | Ht 70.0 in | Wt 159.0 lb

## 2018-11-20 DIAGNOSIS — K7010 Alcoholic hepatitis without ascites: Secondary | ICD-10-CM

## 2018-11-20 DIAGNOSIS — D649 Anemia, unspecified: Secondary | ICD-10-CM

## 2018-11-20 DIAGNOSIS — F17200 Nicotine dependence, unspecified, uncomplicated: Secondary | ICD-10-CM

## 2018-11-20 DIAGNOSIS — F39 Unspecified mood [affective] disorder: Secondary | ICD-10-CM

## 2018-11-20 DIAGNOSIS — F1011 Alcohol abuse, in remission: Secondary | ICD-10-CM

## 2018-11-20 NOTE — Progress Notes (Signed)
BP 126/70 (BP Location: Left Arm, Patient Position: Sitting, Cuff Size: Normal)   Pulse 86   Temp 98.2 F (36.8 C) (Other (Comment))   Ht 5\' 10"  (1.778 m)   Wt 159 lb (72.1 kg)   SpO2 98%   BMI 22.81 kg/m    Subjective:    Patient ID: Tom Herman, male    DOB: 08-25-1981, 38 y.o.   MRN: [id number]  HPI: Tom Herman is a 38 y.o. male presenting on 11/20/2018 for Follow-up   HPI  Pt has still not turned in his [facility] application.    He has he is not drinking  Pt is couch hopping with friends (as he would otherwise be homeless)  Pt only takes his meds sometimes.  He says he sometimes has abdominal pain.  Discussed that taking his med every day might resolve his abd apiin  Relevant past medical, surgical, family and social history reviewed and updated as indicated. Interim medical history since our last visit reviewed. Allergies and medications reviewed and updated.   Current Outpatient Medications:  .  omeprazole (PRILOSEC) 40 MG capsule, Take 1 capsule (40 mg total) by mouth daily before breakfast., Disp: 90 capsule, Rfl: 3 .  Omega-3 Fatty Acids (FISH OIL PO), Take 1 tablet by mouth daily., Disp: , Rfl:     Review of Systems  Constitutional: Positive for chills and diaphoresis. Negative for appetite change, fatigue, fever and unexpected weight change.  HENT: Positive for dental problem. Negative for congestion, drooling, ear pain, facial swelling, hearing loss, mouth sores, sneezing, sore throat, trouble swallowing and voice change.   Eyes: Negative for pain, discharge, redness, itching and visual disturbance.  Respiratory: Negative for cough, choking, shortness of breath and wheezing.   Cardiovascular: Negative for chest pain, palpitations and leg swelling.  Gastrointestinal: Positive for abdominal pain. Negative for blood in stool, constipation, diarrhea and vomiting.  Endocrine: Negative for cold intolerance, heat intolerance and polydipsia.   Genitourinary: Negative for decreased urine volume, dysuria and hematuria.  Musculoskeletal: Positive for arthralgias. Negative for back pain and gait problem.  Skin: Negative for rash.  Allergic/Immunologic: Negative for environmental allergies.  Neurological: Negative for seizures, syncope, light-headedness and headaches.  Hematological: Negative for adenopathy.  Psychiatric/Behavioral: Positive for agitation and dysphoric mood. Negative for suicidal ideas. The patient is nervous/anxious.     Per HPI unless specifically indicated above     Objective:    BP 126/70 (BP Location: Left Arm, Patient Position: Sitting, Cuff Size: Normal)   Pulse 86   Temp 98.2 F (36.8 C) (Other (Comment))   Ht 5\' 10"  (1.778 m)   Wt 159 lb (72.1 kg)   SpO2 98%   BMI 22.81 kg/m   Wt Readings from Last 3 Encounters:  11/20/18 159 lb (72.1 kg)  09/11/18 147 lb 3.2 oz (66.8 kg)  08/21/18 146 lb 8 oz (66.5 kg)    Physical Exam Vitals signs reviewed.  Constitutional:      Appearance: He is well-developed.  HENT:     Head: Normocephalic and atraumatic.  Neck:     Musculoskeletal: Neck supple.  Cardiovascular:     Rate and Rhythm: Normal rate and regular rhythm.  Pulmonary:     Effort: Pulmonary effort is normal.     Breath sounds: Normal breath sounds. No wheezing.  Abdominal:     General: Bowel sounds are normal.     Palpations: Abdomen is soft.     Tenderness: There is no abdominal tenderness.  Lymphadenopathy:     Cervical: No cervical adenopathy.  Skin:    General: Skin is warm and dry.  Neurological:     Mental Status: He is alert and oriented to person, place, and time.  Psychiatric:        Behavior: Behavior normal.     Results for orders placed or performed during the hospital encounter of 09/18/18  CBC with Differential/Platelet  Result Value Ref Range   WBC 7.1 4.0 - 10.5 K/uL   RBC 4.11 (L) 4.22 - 5.81 MIL/uL   Hemoglobin 12.8 (L) 13.0 - 17.0 g/dL   HCT 41.6 (L) 60.6 -  52.0 %   MCV 92.7 80.0 - 100.0 fL   MCH 31.1 26.0 - 34.0 pg   MCHC 33.6 30.0 - 36.0 g/dL   RDW 30.1 60.1 - 09.3 %   Platelets 131 (L) 150 - 400 K/uL   nRBC 0.0 0.0 - 0.2 %   Neutrophils Relative % 61 %   Neutro Abs 4.3 1.7 - 7.7 K/uL   Lymphocytes Relative 25 %   Lymphs Abs 1.7 0.7 - 4.0 K/uL   Monocytes Relative 11 %   Monocytes Absolute 0.8 0.1 - 1.0 K/uL   Eosinophils Relative 2 %   Eosinophils Absolute 0.2 0.0 - 0.5 K/uL   Basophils Relative 1 %   Basophils Absolute 0.0 0.0 - 0.1 K/uL   Immature Granulocytes 0 %   Abs Immature Granulocytes 0.01 0.00 - 0.07 K/uL   Giant PLTs PRESENT   Comprehensive metabolic panel  Result Value Ref Range   Sodium 136 135 - 145 mmol/L   Potassium 4.3 3.5 - 5.1 mmol/L   Chloride 104 98 - 111 mmol/L   CO2 24 22 - 32 mmol/L   Glucose, Bld 96 70 - 99 mg/dL   BUN 14 6 - 20 mg/dL   Creatinine, Ser 2.35 (L) 0.61 - 1.24 mg/dL   Calcium 9.6 8.9 - 57.3 mg/dL   Total Protein 7.6 6.5 - 8.1 g/dL   Albumin 4.2 3.5 - 5.0 g/dL   AST 23 15 - 41 U/L   ALT 16 0 - 44 U/L   Alkaline Phosphatase 86 38 - 126 U/L   Total Bilirubin 2.3 (H) 0.3 - 1.2 mg/dL   GFR calc non Af Amer >60 >60 mL/min   GFR calc Af Amer >60 >60 mL/min   Anion gap 8 5 - 15  Protime-INR  Result Value Ref Range   Prothrombin Time 16.6 (H) 11.4 - 15.2 seconds   INR 1.35       Assessment & Plan:    Encounter Diagnoses  Name Primary?  . Alcoholic hepatitis, unspecified whether ascites present Yes  . Tobacco use disorder   . Alcohol abuse, in remission   . Mood disorder (HCC)   . Anemia, unspecified type    -pt to continue with GI for abdominal pain -encouraged pt to go to [facility] for anxiety and depression as well has etoh -encouraged pt to get [facility] application submitted -Pt to follow up 7 months.   RTO sooner prn

## 2018-11-20 NOTE — Patient Instructions (Signed)
Financial Counselor- [phone] Call about [facility]

## 2019-06-24 ENCOUNTER — Encounter: Payer: Self-pay | Admitting: Physician Assistant

## 2019-06-24 ENCOUNTER — Ambulatory Visit: Payer: Self-pay | Admitting: Physician Assistant

## 2019-06-24 ENCOUNTER — Other Ambulatory Visit: Payer: Self-pay

## 2019-06-24 VITALS — BP 159/102 | HR 87 | Temp 98.2°F

## 2019-06-24 DIAGNOSIS — F17200 Nicotine dependence, unspecified, uncomplicated: Secondary | ICD-10-CM

## 2019-06-24 DIAGNOSIS — F1010 Alcohol abuse, uncomplicated: Secondary | ICD-10-CM

## 2019-06-24 DIAGNOSIS — K7010 Alcoholic hepatitis without ascites: Secondary | ICD-10-CM

## 2019-06-24 DIAGNOSIS — R030 Elevated blood-pressure reading, without diagnosis of hypertension: Secondary | ICD-10-CM

## 2019-06-24 MED ORDER — OMEPRAZOLE 40 MG PO CPDR: 40.0000 mg | DELAYED_RELEASE_CAPSULE | Freq: Every day | ORAL | 3 refills | Status: DC

## 2019-06-24 MED ORDER — AMOXICILLIN 500 MG PO CAPS: 500.0000 mg | ORAL_CAPSULE | Freq: Three times a day (TID) | ORAL | 0 refills | Status: DC

## 2019-06-24 NOTE — Progress Notes (Signed)
BP (!) 160/100   Pulse 87   Temp 98.2 F (36.8 C)   SpO2 99%    Subjective:    Patient ID: Tom Herman, male    DOB: 07/13/1981, 38 y.o.   MRN: [id number]  HPI: Tom Herman is a 38 y.o. male presenting on 06/24/2019 for No chief complaint on file.   HPI   Pt had negative covid 19 screening questionnaire     Pt is still couch hopping as he does not have his own home.    Pt has "slipped up" a few times with drinking.    He doesn't want to go to [employer]    Pt has thought about counseling but hasn't gone.    Pt is taking care of his father (s/p CVA)   at times- in and out.    He Still gets heartburn at times and bloating.  He is not using his omeprazole.   Pt Denies drugs other than MJ.  Denies OTC cold meds.   Pt states toothache which has some pain. He thinks that is why his bp high today.     Relevant past medical, surgical, family and social history reviewed and updated as indicated. Interim medical history since our last visit reviewed. Allergies and medications reviewed and updated.    Current Outpatient Medications:  .  Omega-3 Fatty Acids (FISH OIL PO), Take 1 tablet by mouth daily., Disp: , Rfl:  .  omeprazole (PRILOSEC) 40 MG capsule, Take 1 capsule (40 mg total) by mouth daily before breakfast., Disp: 30 capsule, Rfl: 3   Review of Systems  Per HPI unless specifically indicated above     Objective:    BP (!) 160/100   Pulse 87   Temp 98.2 F (36.8 C)   SpO2 99%   Wt Readings from Last 3 Encounters:  11/20/18 159 lb (72.1 kg)  09/11/18 147 lb 3.2 oz (66.8 kg)  08/21/18 146 lb 8 oz (66.5 kg)    Physical Exam Vitals signs reviewed.  Constitutional:      General: He is not in acute distress.    Appearance: Normal appearance. He is well-developed. He is not ill-appearing.  HENT:     Head: Normocephalic and atraumatic.  Neck:     Musculoskeletal: Neck supple.  Cardiovascular:     Rate and Rhythm: Normal rate and regular  rhythm.  Pulmonary:     Effort: Pulmonary effort is normal.     Breath sounds: Normal breath sounds. No wheezing.  Abdominal:     General: Bowel sounds are normal.     Palpations: Abdomen is soft.     Tenderness: There is no abdominal tenderness.  Lymphadenopathy:     Cervical: No cervical adenopathy.  Skin:    General: Skin is warm and dry.  Neurological:     Mental Status: He is alert and oriented to person, place, and time.  Psychiatric:        Attention and Perception: Attention normal.        Speech: Speech normal.        Behavior: Behavior normal. Behavior is cooperative.           Assessment & Plan:    Encounter Diagnoses  Name Primary?  . Alcoholic hepatitis, unspecified whether ascites present Yes  . Tobacco use disorder   . Alcohol abuse   . Elevated blood pressure reading      -pt counseled again to Get on omperazole daily.  Refill sent -will  Update labs -rx amoxil for tooth.   Dental list -pt given contact informaiton for financial counselor  (so he can return to GI) -encouraged pt to get counseling -f/ u 1 month to recheck bp.  He is to contact office sooner prn

## 2019-06-24 NOTE — Patient Instructions (Signed)
Financial counselor- [phone]   

## 2019-07-15 ENCOUNTER — Other Ambulatory Visit (HOSPITAL_COMMUNITY)
Admission: RE | Admit: 2019-07-15 | Discharge: 2019-07-15 | Disposition: A | Discharge status: Home / Self Care | Payer: Self-pay | Source: Ambulatory Visit | Attending: Physician Assistant | Admitting: Physician Assistant

## 2019-07-15 DIAGNOSIS — F1010 Alcohol abuse, uncomplicated: Secondary | ICD-10-CM | POA: Insufficient documentation

## 2019-07-15 DIAGNOSIS — K7010 Alcoholic hepatitis without ascites: Secondary | ICD-10-CM | POA: Insufficient documentation

## 2019-07-15 DIAGNOSIS — D649 Anemia, unspecified: Secondary | ICD-10-CM | POA: Insufficient documentation

## 2019-07-15 LAB — CBC WITH DIFFERENTIAL/PLATELET
Abs Immature Granulocytes: 0.03 10*3/uL (ref 0.00–0.07)
Basophils Absolute: 0.1 10*3/uL (ref 0.0–0.1)
Basophils Relative: 1 %
Eosinophils Absolute: 0.4 10*3/uL (ref 0.0–0.5)
Eosinophils Relative: 3 %
HCT: 48 % (ref 39.0–52.0)
Hemoglobin: 16.1 g/dL (ref 13.0–17.0)
Immature Granulocytes: 0 %
Lymphocytes Relative: 17 %
Lymphs Abs: 2.2 10*3/uL (ref 0.7–4.0)
MCH: 31.1 pg (ref 26.0–34.0)
MCHC: 33.5 g/dL (ref 30.0–36.0)
MCV: 92.7 fL (ref 80.0–100.0)
Monocytes Absolute: 1 10*3/uL (ref 0.1–1.0)
Monocytes Relative: 8 %
Neutro Abs: 9 10*3/uL — ABNORMAL HIGH (ref 1.7–7.7)
Neutrophils Relative %: 71 %
Platelets: 181 10*3/uL (ref 150–400)
RBC: 5.18 MIL/uL (ref 4.22–5.81)
RDW: 13 % (ref 11.5–15.5)
WBC: 12.7 10*3/uL — ABNORMAL HIGH (ref 4.0–10.5)
nRBC: 0 % (ref 0.0–0.2)

## 2019-07-15 LAB — COMPREHENSIVE METABOLIC PANEL
ALT: 48 U/L — ABNORMAL HIGH (ref 0–44)
AST: 58 U/L — ABNORMAL HIGH (ref 15–41)
Albumin: 4.4 g/dL (ref 3.5–5.0)
Alkaline Phosphatase: 152 U/L — ABNORMAL HIGH (ref 38–126)
Anion gap: 11 (ref 5–15)
BUN: 10 mg/dL (ref 6–20)
CO2: 25 mmol/L (ref 22–32)
Calcium: 9.3 mg/dL (ref 8.9–10.3)
Chloride: 100 mmol/L (ref 98–111)
Creatinine, Ser: 0.82 mg/dL (ref 0.61–1.24)
GFR calc Af Amer: >60 mL/min (ref >60)
GFR calc non Af Amer: >60 mL/min (ref >60)
Glucose, Bld: 111 mg/dL — ABNORMAL HIGH (ref 70–99)
Potassium: 4 mmol/L (ref 3.5–5.1)
Sodium: 136 mmol/L (ref 135–145)
Total Bilirubin: 1.5 mg/dL — ABNORMAL HIGH (ref 0.3–1.2)
Total Protein: 7.8 g/dL (ref 6.5–8.1)

## 2019-07-15 LAB — PROTIME-INR
INR: 1 (ref 0.8–1.2)
Prothrombin Time: 13.5 seconds (ref 11.4–15.2)

## 2019-07-25 ENCOUNTER — Ambulatory Visit: Payer: Self-pay | Admitting: Physician Assistant

## 2019-07-29 ENCOUNTER — Ambulatory Visit (INDEPENDENT_AMBULATORY_CARE_PROVIDER_SITE_OTHER): Payer: Self-pay | Admitting: Gastroenterology

## 2019-07-29 ENCOUNTER — Ambulatory Visit: Payer: Self-pay | Admitting: Gastroenterology

## 2019-07-29 ENCOUNTER — Encounter: Payer: Self-pay | Admitting: Gastroenterology

## 2019-07-29 ENCOUNTER — Other Ambulatory Visit: Payer: Self-pay

## 2019-07-29 VITALS — BP 153/98 | HR 78 | Temp 97.0°F | Ht 71.0 in | Wt 191.8 lb

## 2019-07-29 DIAGNOSIS — R945 Abnormal results of liver function studies: Secondary | ICD-10-CM

## 2019-07-29 DIAGNOSIS — R1011 Right upper quadrant pain: Secondary | ICD-10-CM

## 2019-07-29 DIAGNOSIS — R7989 Other specified abnormal findings of blood chemistry: Secondary | ICD-10-CM | POA: Insufficient documentation

## 2019-07-29 NOTE — Patient Instructions (Signed)
Please go for ultrasound and labs at [facility] as scheduled. We will contact you with results as available.  Please avoid alcohol use. It is detrimental to your liver. Limit tylenol to no more than 2000mg  per day, preferably less.

## 2019-07-29 NOTE — Progress Notes (Signed)
cc'ed to pcp °

## 2019-07-29 NOTE — Assessment & Plan Note (Signed)
asant 38 year old gentleman with history of alcohol abuse, alcoholic hepatitis, chronic right upper quadrant pain who we saw back in 2019 presenting for follow-up.  Due to Covid his follow-up has been delayed.  He continues to have right upper quadrant pain which appears mostly positional, not necessarily related to meals.  He also complains of bloating in the upper abdomen.  No noted edema.  He has gained 45 pounds.  Since we last saw him he has had some slips with alcohol a few times over the summer.  His last alcohol consumption he reports to be September.  Unfortunately he tells me today that he has been using Percocet or Suboxone which he gets from a friend to help manage his pain, alcohol urges.  He continues to be "homeless".  He is living with various friends.  He continues to see his dad on a daily basis, taking care of him since his strokes.  His dad has hepatitis C and patient has concerns about possible transmission of hepatitis C from his father.    Recent labs with abnormal LFTs, his LFTs are actually better back in December 2019.  This may be due to relapse of alcohol recently.  We will reevaluate for hepatitis C.  Check other serologies which have not been done in the past.  Update ultrasound of the abdomen due to bloating and pain.  Check for any progression to cirrhosis or splenomegaly.  Further recommendations to follow.
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
## 2019-07-31 ENCOUNTER — Encounter: Payer: Self-pay | Admitting: Physician Assistant

## 2019-07-31 ENCOUNTER — Ambulatory Visit: Payer: Self-pay | Admitting: Physician Assistant

## 2019-07-31 DIAGNOSIS — I10 Essential (primary) hypertension: Secondary | ICD-10-CM

## 2019-07-31 DIAGNOSIS — K7010 Alcoholic hepatitis without ascites: Secondary | ICD-10-CM

## 2019-07-31 DIAGNOSIS — R7989 Other specified abnormal findings of blood chemistry: Secondary | ICD-10-CM

## 2019-07-31 DIAGNOSIS — F17200 Nicotine dependence, unspecified, uncomplicated: Secondary | ICD-10-CM

## 2019-07-31 DIAGNOSIS — F1011 Alcohol abuse, in remission: Secondary | ICD-10-CM

## 2019-07-31 MED ORDER — LISINOPRIL 10 MG PO TABS: 10.0000 mg | ORAL_TABLET | Freq: Every day | ORAL | 1 refills | Status: DC

## 2019-07-31 NOTE — Progress Notes (Signed)
There were no vitals taken for this visit.   Subjective:    Patient ID: Tom Herman, male    DOB: [birth year], 38 y.o.   MRN: [id number]  HPI: Tom Herman is a 38 y.o. male presenting on 07/31/2019 for No chief complaint on file.   HPI  This is a telemedicine appointment through [employer] due to coronavirus pandemic.  I connected with  Tom Herman on 07/31/19 by a video enabled telemedicine application and verified that I am speaking with the correct person using two identifiers.   I discussed the limitations of evaluation and management by telemedicine. The patient expressed understanding and agreed to proceed.  Patient is at his father's house.  Provider is at office.   Patient is a 38 year old male with history of alcohol abuse.  Patient states that he has had no alcohol in a little over a month.  He says he is doing well just still occasionally feels bloated.  Patient went to gastroenterology appointment on Monday of this week.  Ultrasound and blood tests were ordered.    Relevant past medical, surgical, family and social history reviewed and updated as indicated. Interim medical history since our last visit reviewed. Allergies and medications reviewed and updated.    Current Outpatient Medications:  .  Buprenorphine HCl-Naloxone HCl (SUBOXONE SL), Place under the tongue., Disp: , Rfl:  .  omeprazole (PRILOSEC) 40 MG capsule, Take 1 capsule (40 mg total) by mouth daily before breakfast., Disp: 30 capsule, Rfl: 3 .  oxyCODONE-acetaminophen (PERCOCET/ROXICET) 5-325 MG tablet, Take by mouth every 4 (four) hours as needed for severe pain., Disp: , Rfl:     Review of Systems  Per HPI unless specifically indicated above     Objective:     Vitals with BMI 07/29/2019 06/24/2019 06/24/2019  Height 5\' 11"  - -  Weight 191 lbs 13 oz - -  BMI 26.76 - -  Systolic 153 159  Diastolic 98 102 100  Pulse 78 - 87     There were no vitals taken for this visit.  Wt  Readings from Last 3 Encounters:  07/29/19 191 lb 12.8 oz (87 kg)  11/20/18 159 lb (72.1 kg)  09/11/18 147 lb 3.2 oz (66.8 kg)    Physical Exam Constitutional:      General: He is not in acute distress.    Appearance: Normal appearance. He is not ill-appearing.  HENT:     Head: Normocephalic and atraumatic.  Pulmonary:     Effort: Pulmonary effort is normal. No respiratory distress.  Neurological:     Mental Status: He is alert and oriented to person, place, and time.  Psychiatric:        Attention and Perception: Attention normal.        Speech: Speech normal.        Behavior: Behavior is cooperative.     Results for orders placed or performed during the hospital encounter of 07/15/19  Comprehensive metabolic panel  Result Value Ref Range   Sodium 136 135 - 145 mmol/L   Potassium 4.0 3.5 - 5.1 mmol/L   Chloride 100 98 - 111 mmol/L   CO2 25 22 - 32 mmol/L   Glucose, Bld 111 (H) 70 - 99 mg/dL   BUN 10 6 - 20 mg/dL   Creatinine, Ser 07/17/19 0.61 - 1.24 mg/dL   Calcium 9.3 8.9 - 3.33 mg/dL   Total Protein 7.8 6.5 - 8.1 g/dL   Albumin 4.4 3.5 - 5.0 g/dL  AST 58 (H) 15 - 41 U/L   ALT 48 (H) 0 - 44 U/L   Alkaline Phosphatase 152 (H) 38 - 126 U/L   Total Bilirubin 1.5 (H) 0.3 - 1.2 mg/dL   GFR calc non Af Amer >60 >60 mL/min   GFR calc Af Amer >60 >60 mL/min   Anion gap 11 5 - 15  INR/PT  Result Value Ref Range   Prothrombin Time 13.5 11.4 - 15.2 seconds   INR 1.0 0.8 - 1.2  CBC with Differential  Result Value Ref Range   WBC 12.7 (H) 4.0 - 10.5 K/uL   RBC 5.18 4.22 - 5.81 MIL/uL   Hemoglobin 16.1 13.0 - 17.0 g/dL   HCT 48.0 39.0 - 52.0 %   MCV 92.7 80.0 - 100.0 fL   MCH 31.1 26.0 - 34.0 pg   MCHC 33.5 30.0 - 36.0 g/dL   RDW 13.0 11.5 - 15.5 %   Platelets 181 150 - 400 K/uL   nRBC 0.0 0.0 - 0.2 %   Neutrophils Relative % 71 %   Neutro Abs 9.0 (H) 1.7 - 7.7 K/uL   Lymphocytes Relative 17 %   Lymphs Abs 2.2 0.7 - 4.0 K/uL   Monocytes Relative 8 %   Monocytes  Absolute 1.0 0.1 - 1.0 K/uL   Eosinophils Relative 3 %   Eosinophils Absolute 0.4 0.0 - 0.5 K/uL   Basophils Relative 1 %   Basophils Absolute 0.1 0.0 - 0.1 K/uL   Immature Granulocytes 0 %   Abs Immature Granulocytes 0.03 0.00 - 0.07 K/uL      Assessment & Plan:    1.  Hypertension  Last several blood pressure readings were elevated.  Will add lisinopril for blood pressure control.  Pt has bp machine at home.  Patient will have follow-up telemedicine visit in 1 month to recheck blood pressure.  2.  Elevated LFTs/abdominal pain  Patient to continue with gastroenterology per their recommendations.  3.  Alcohol abuse in remission  Patient encouraged to continue to avoid alcohol    Patient to contact office if needed prior to scheduled appointment follow-up

## 2019-08-05 ENCOUNTER — Other Ambulatory Visit (HOSPITAL_COMMUNITY)
Admission: RE | Admit: 2019-08-05 | Discharge: 2019-08-05 | Disposition: A | Discharge status: Home / Self Care | Payer: Self-pay | Source: Ambulatory Visit | Attending: Gastroenterology | Admitting: Gastroenterology

## 2019-08-05 ENCOUNTER — Other Ambulatory Visit: Payer: Self-pay

## 2019-08-05 ENCOUNTER — Ambulatory Visit (HOSPITAL_COMMUNITY)
Admission: RE | Admit: 2019-08-05 | Discharge: 2019-08-05 | Disposition: A | Discharge status: Home / Self Care | Payer: Self-pay | Source: Ambulatory Visit | Attending: Gastroenterology | Admitting: Gastroenterology

## 2019-08-05 ENCOUNTER — Other Ambulatory Visit (HOSPITAL_COMMUNITY)
Admission: RE | Admit: 2019-08-05 | Discharge: 2019-08-05 | Disposition: A | Discharge status: Home / Self Care | Payer: Self-pay | Source: Ambulatory Visit | Attending: Physician Assistant | Admitting: Physician Assistant

## 2019-08-05 DIAGNOSIS — R7989 Other specified abnormal findings of blood chemistry: Secondary | ICD-10-CM

## 2019-08-05 DIAGNOSIS — R1011 Right upper quadrant pain: Secondary | ICD-10-CM | POA: Insufficient documentation

## 2019-08-05 DIAGNOSIS — R945 Abnormal results of liver function studies: Secondary | ICD-10-CM | POA: Insufficient documentation

## 2019-08-05 DIAGNOSIS — R69 Illness, unspecified: Secondary | ICD-10-CM | POA: Insufficient documentation

## 2019-08-05 LAB — COMPREHENSIVE METABOLIC PANEL
ALT: 31 U/L (ref 0–44)
AST: 24 U/L (ref 15–41)
Albumin: 4.6 g/dL (ref 3.5–5.0)
Alkaline Phosphatase: 116 U/L (ref 38–126)
Anion gap: 10 (ref 5–15)
BUN: 10 mg/dL (ref 6–20)
CO2: 26 mmol/L (ref 22–32)
Calcium: 9.6 mg/dL (ref 8.9–10.3)
Chloride: 103 mmol/L (ref 98–111)
Creatinine, Ser: 0.78 mg/dL (ref 0.61–1.24)
GFR calc Af Amer: >60 mL/min (ref >60)
GFR calc non Af Amer: >60 mL/min (ref >60)
Glucose, Bld: 102 mg/dL — ABNORMAL HIGH (ref 70–99)
Potassium: 4.3 mmol/L (ref 3.5–5.1)
Sodium: 139 mmol/L (ref 135–145)
Total Bilirubin: 1.5 mg/dL — ABNORMAL HIGH (ref 0.3–1.2)
Total Protein: 7.8 g/dL (ref 6.5–8.1)

## 2019-08-05 LAB — IRON AND TIBC
Iron: 148 ug/dL (ref 45–182)
Saturation Ratios: 34 % (ref 17.9–39.5)
TIBC: 432 ug/dL (ref 250–450)
UIBC: 284 ug/dL

## 2019-08-05 LAB — CBC WITH DIFFERENTIAL/PLATELET
Abs Immature Granulocytes: 0.02 10*3/uL (ref 0.00–0.07)
Basophils Absolute: 0.1 10*3/uL (ref 0.0–0.1)
Basophils Relative: 1 %
Eosinophils Absolute: 0.4 10*3/uL (ref 0.0–0.5)
Eosinophils Relative: 4 %
HCT: 47.2 % (ref 39.0–52.0)
Hemoglobin: 16.1 g/dL (ref 13.0–17.0)
Immature Granulocytes: 0 %
Lymphocytes Relative: 27 %
Lymphs Abs: 2.2 10*3/uL (ref 0.7–4.0)
MCH: 31.5 pg (ref 26.0–34.0)
MCHC: 34.1 g/dL (ref 30.0–36.0)
MCV: 92.4 fL (ref 80.0–100.0)
Monocytes Absolute: 0.6 10*3/uL (ref 0.1–1.0)
Monocytes Relative: 7 %
Neutro Abs: 5.2 10*3/uL (ref 1.7–7.7)
Neutrophils Relative %: 61 %
Platelets: 204 10*3/uL (ref 150–400)
RBC: 5.11 MIL/uL (ref 4.22–5.81)
RDW: 12.2 % (ref 11.5–15.5)
WBC: 8.4 10*3/uL (ref 4.0–10.5)
nRBC: 0 % (ref 0.0–0.2)

## 2019-08-05 LAB — PROTIME-INR
INR: 1.1 (ref 0.8–1.2)
Prothrombin Time: 14.4 seconds (ref 11.4–15.2)

## 2019-08-05 LAB — FERRITIN: Ferritin: 94 ng/mL (ref 24–336)

## 2019-08-05 LAB — LIPASE, BLOOD: Lipase: 33 U/L (ref 11–51)

## 2019-08-06 LAB — HEPATITIS A ANTIBODY, TOTAL: hep A Total Ab: NEGATIVE — AB

## 2019-08-06 LAB — HEPATITIS C ANTIBODY (REFLEX): HCV Ab: <0.1 s/co ratio (ref 0.0–0.9)

## 2019-08-06 LAB — IGG, IGA, IGM
IgA: 323 mg/dL (ref 90–386)
IgG (Immunoglobin G), Serum: 1245 mg/dL (ref 603–1613)
IgM (Immunoglobulin M), Srm: 107 mg/dL (ref 20–172)

## 2019-08-06 LAB — HCV COMMENT:

## 2019-08-06 LAB — HEPATITIS B SURFACE ANTIGEN: Hepatitis B Surface Ag: NEGATIVE — AB

## 2019-08-06 LAB — ANA: Anti Nuclear Antibody (ANA): NEGATIVE

## 2019-08-06 LAB — HEPATITIS B SURFACE ANTIBODY, QUANTITATIVE: Hep B S AB Quant (Post): <3.1 m[IU]/mL — ABNORMAL LOW (ref >9.9)

## 2019-08-07 LAB — MITOCHONDRIAL ANTIBODIES: Mitochondrial M2 Ab, IgG: 44.3 Units — ABNORMAL HIGH (ref 0.0–20.0)

## 2019-08-07 LAB — ANTI-SMOOTH MUSCLE ANTIBODY, IGG: F-Actin IgG: 4 Units (ref 0–19)

## 2019-08-19 ENCOUNTER — Other Ambulatory Visit: Payer: Self-pay

## 2019-08-19 ENCOUNTER — Encounter: Payer: Self-pay | Admitting: *Deleted

## 2019-08-19 ENCOUNTER — Other Ambulatory Visit: Payer: Self-pay | Admitting: *Deleted

## 2019-08-19 DIAGNOSIS — R1010 Upper abdominal pain, unspecified: Secondary | ICD-10-CM

## 2019-08-19 DIAGNOSIS — R7989 Other specified abnormal findings of blood chemistry: Secondary | ICD-10-CM

## 2019-08-19 DIAGNOSIS — R945 Abnormal results of liver function studies: Secondary | ICD-10-CM

## 2019-08-20 ENCOUNTER — Encounter: Payer: Self-pay | Admitting: *Deleted

## 2019-08-27 ENCOUNTER — Encounter: Payer: Self-pay | Admitting: Physician Assistant

## 2019-08-27 ENCOUNTER — Ambulatory Visit: Payer: Self-pay | Admitting: Physician Assistant

## 2019-08-27 VITALS — BP 151/102 | HR 85

## 2019-08-27 DIAGNOSIS — I10 Essential (primary) hypertension: Secondary | ICD-10-CM

## 2019-08-27 MED ORDER — LISINOPRIL 20 MG PO TABS: 20.0000 mg | ORAL_TABLET | Freq: Every day | ORAL | 2 refills | Status: DC

## 2019-08-27 NOTE — Progress Notes (Signed)
   BP (!) 151/102   Pulse 85    Subjective:    Patient ID: Tom Herman, male    DOB: 1981-02-19, 38 y.o.   MRN: [id number]  HPI: Tom Herman is a 38 y.o. male presenting on 08/27/2019 for No chief complaint on file.   HPI   This is at telemedicine appointment due to coronavirus pandemic.  It is via telephone as pt does not have a video enabled device.  I connected with  Tom Herman on 08/27/19 by a video enabled telemedicine application and verified that I am speaking with the correct person using two identifiers.   I discussed the limitations of evaluation and management by telemedicine. The patient expressed understanding and agreed to proceed.  Pt is at his fathers house.  Provider is at office.    Patient is continuing with care by gastroenterology.  He is scheduled for EGD in January.  Patient says he is doing okay and has no new issues today.     Relevant past medical, surgical, family and social history reviewed and updated as indicated. Interim medical history since our last visit reviewed. Allergies and medications reviewed and updated.   Current Outpatient Medications:  .  Buprenorphine HCl-Naloxone HCl (SUBOXONE SL), Place under the tongue., Disp: , Rfl:  .  lisinopril (ZESTRIL) 10 MG tablet, Take 1 tablet (10 mg total) by mouth daily., Disp: 30 tablet, Rfl: 1 .  omeprazole (PRILOSEC) 40 MG capsule, Take 1 capsule (40 mg total) by mouth daily before breakfast., Disp: 30 capsule, Rfl: 3 .  oxyCODONE-acetaminophen (PERCOCET/ROXICET) 5-325 MG tablet, Take by mouth every 4 (four) hours as needed for severe pain., Disp: , Rfl:     Review of Systems  Per HPI unless specifically indicated above     Objective:    BP (!) 151/102   Pulse 85   Wt Readings from Last 3 Encounters:  07/29/19 191 lb 12.8 oz (87 kg)  11/20/18 159 lb (72.1 kg)  09/11/18 147 lb 3.2 oz (66.8 kg)    Physical Exam Pulmonary:     Effort: No respiratory distress.   Neurological:     Mental Status: He is alert and oriented to person, place, and time.  Psychiatric:        Attention and Perception: Attention normal.        Speech: Speech normal.        Behavior: Behavior is cooperative.         Assessment & Plan:      1.  Hypertension  Will Increase lisinopril.  Patient is to continue to monitor blood pressure at home.    Patient to continue with GI per their recommendations.  Patient will follow up in 1 month to recheck blood pressure.  He is to contact office sooner as needed

## 2019-09-01 IMAGING — CT CT ABD-PELV W/ CM
2 of 4 series · 15 of 46 positions shown, 17 images · IV contrast (iopamidol)
Comparison: None.

CLINICAL DATA: Epigastric and low pelvic pain. Enlarged right
testicle. Bloating. Jaundice for 2 weeks. Right testicular pain
beginning 2 weeks ago while running.

EXAM:
CT ABDOMEN AND PELVIS WITH CONTRAST
TECHNIQUE: Multidetector CT imaging of the abdomen and pelvis was performed
using the standard protocol following bolus administration of
intravenous contrast.
CONTRAST:  100mL [id number] IOPAMIDOL ([id number]) INJECTION 61%

[Series 2: axial st · axial · 0.68mm/px · z∈[-697,-247]mm · 12 of 100 slices shown, 14 images]
[im 5/100  soft-tissue]
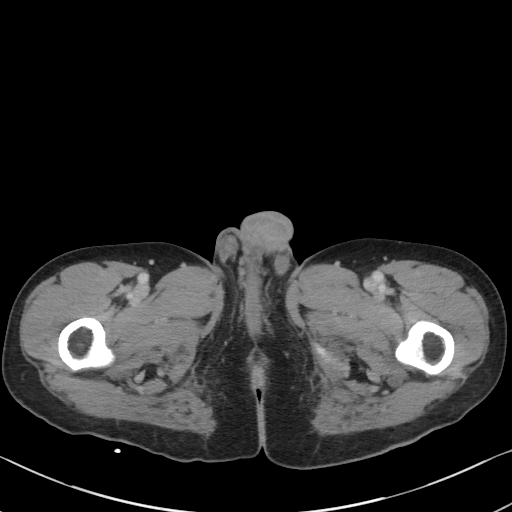
[im 5/100  bone]
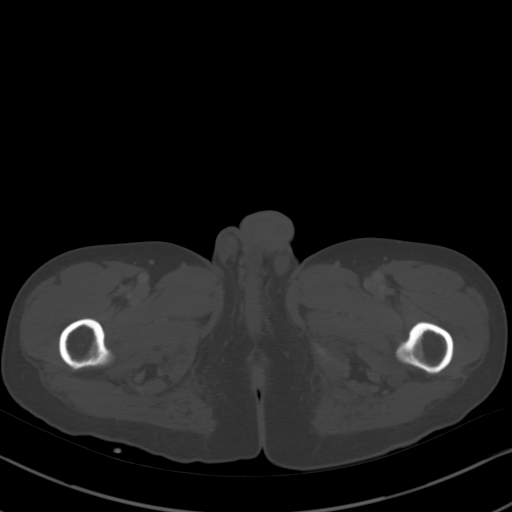
[im 13/100  soft-tissue]
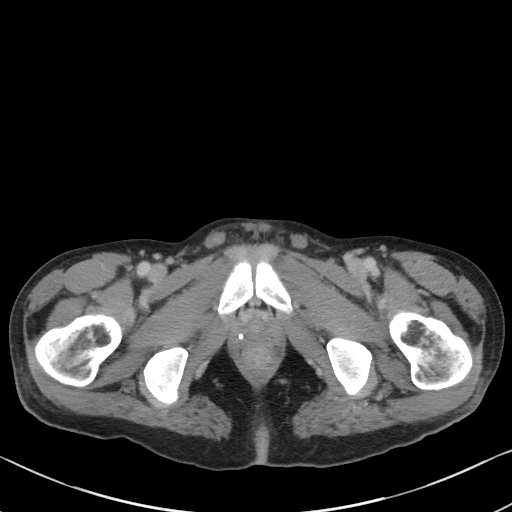
[im 22/100  soft-tissue]
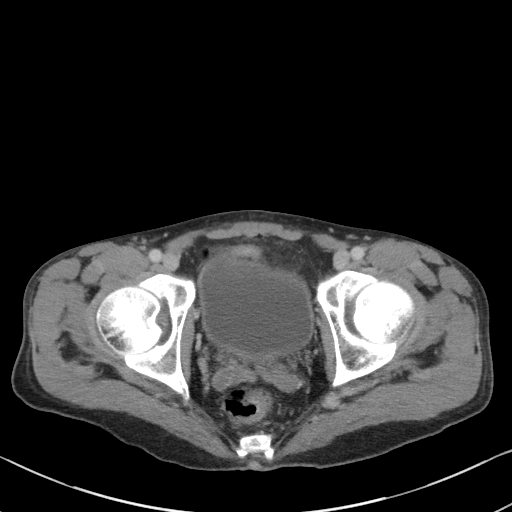
[im 31/100  soft-tissue]
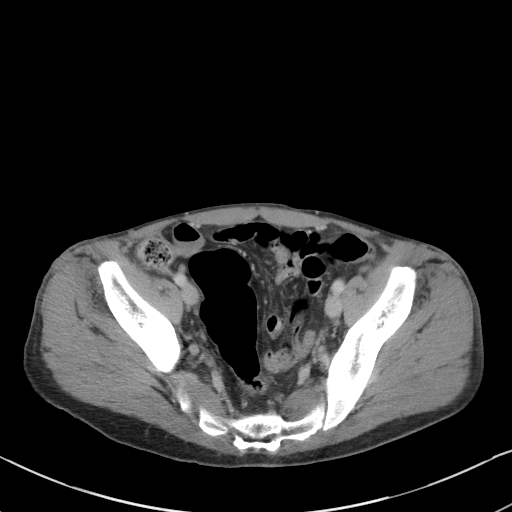
[im 39/100  soft-tissue]
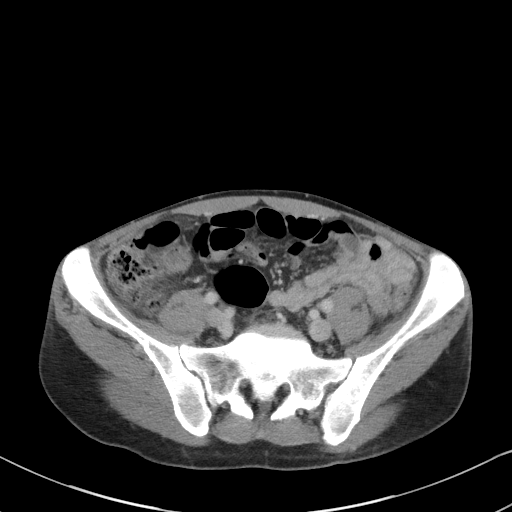
[im 48/100  soft-tissue]
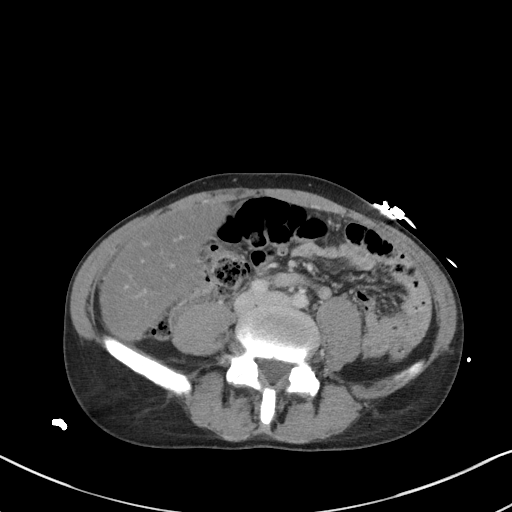
[im 52/100  soft-tissue]
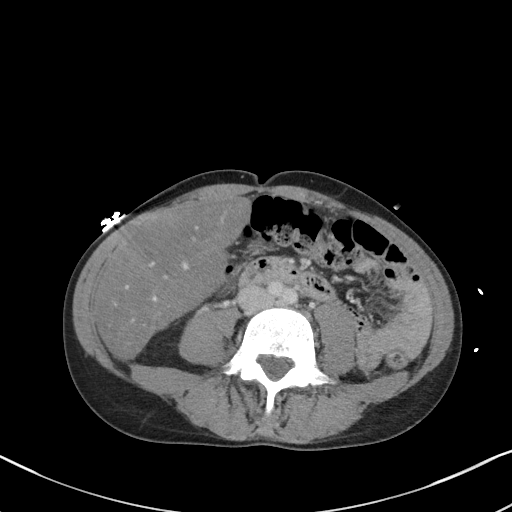
[im 61/100  soft-tissue]
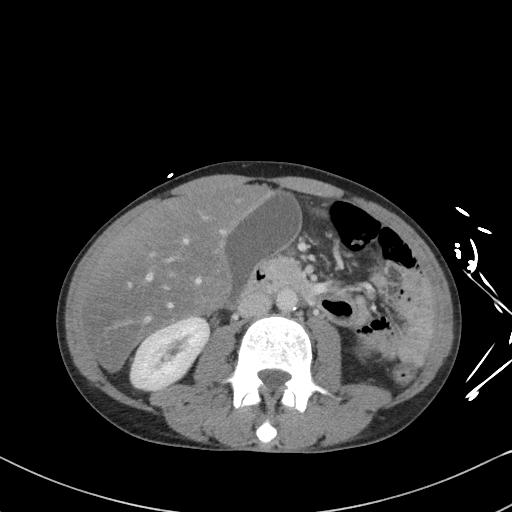
[im 69/100  soft-tissue]
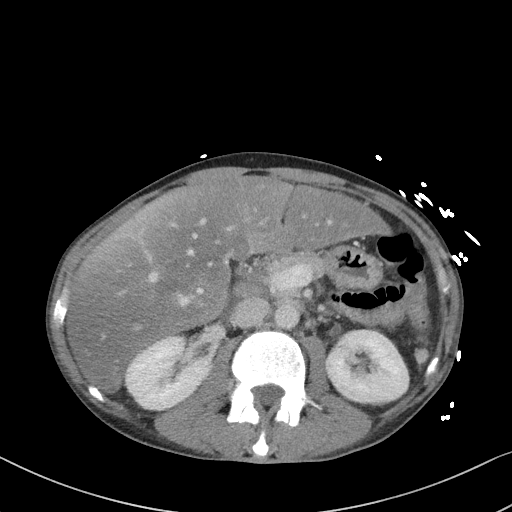
[im 69/100  bone]
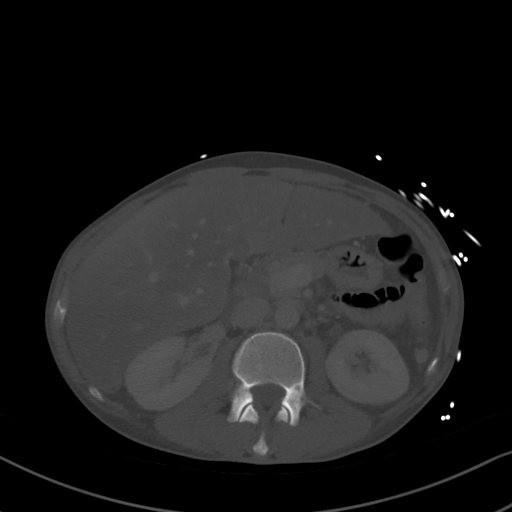
[im 78/100  soft-tissue]
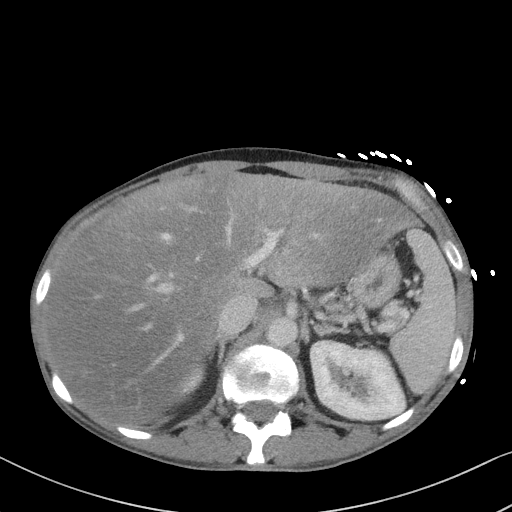
[im 87/100  soft-tissue]
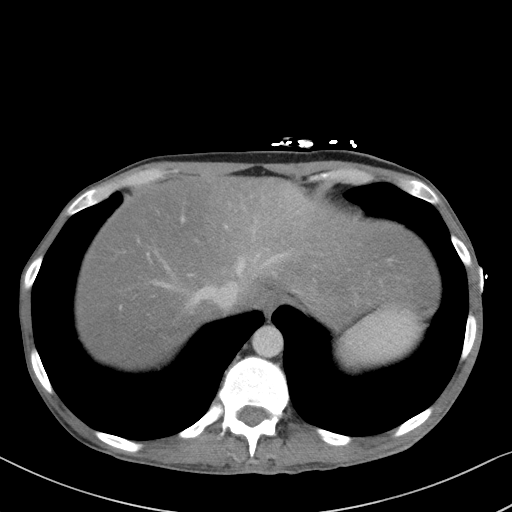
[im 95/100  soft-tissue]
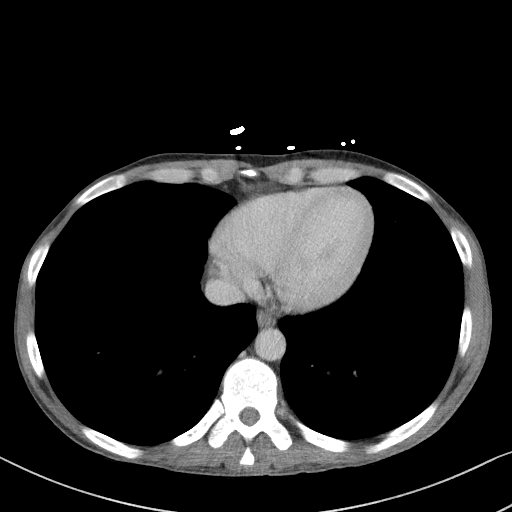

[Series 5: coronal st · coronal · 0.64mm/px · 3 of 82 slices shown]
[im 28/82  soft-tissue]
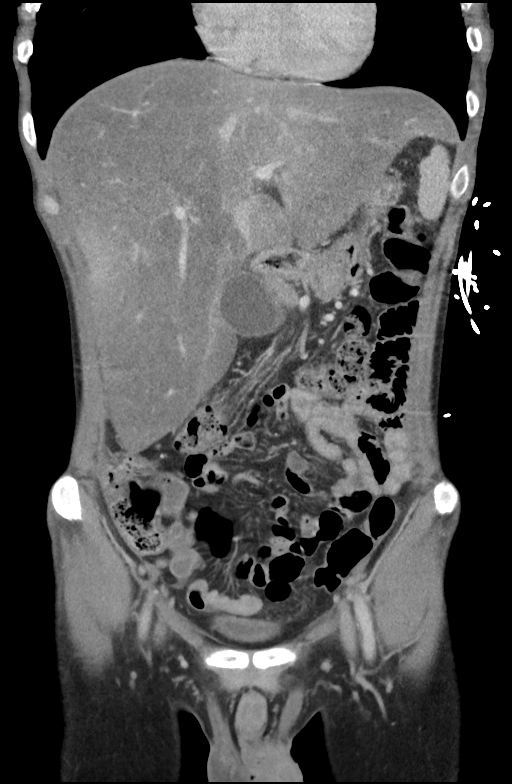
[im 37/82  soft-tissue]
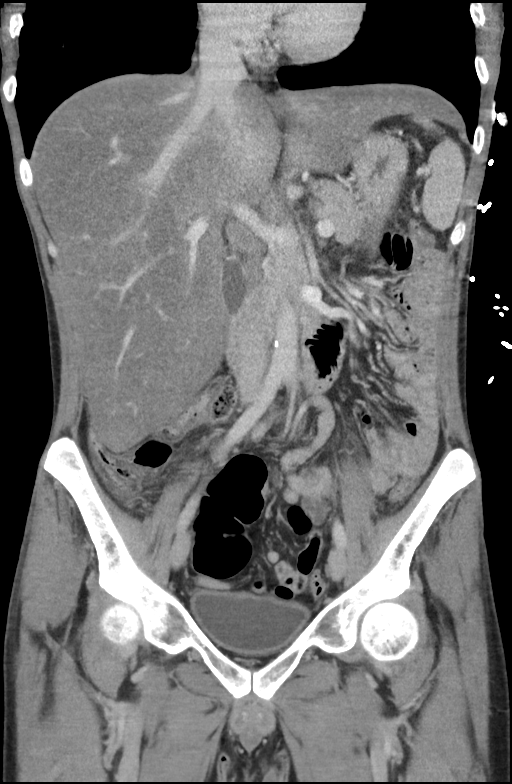
[im 46/82  soft-tissue]
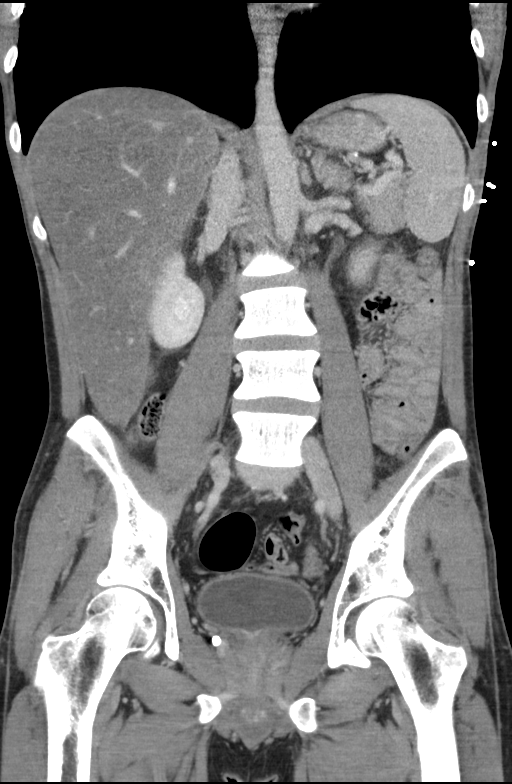

[15 of 46 positions shown; findings below may reference images not displayed]

FINDINGS: Lower chest: The lung bases are clear.

Hepatobiliary: Prominent diffuse fatty infiltration of the liver. No
focal liver lesions identified. Gallbladder is somewhat distended
but there is no wall thickening, inflammatory infiltration, or
stones identified. No bile duct dilatation.

Pancreas: Unremarkable. No pancreatic ductal dilatation or
surrounding inflammatory changes.

Spleen: Normal in size without focal abnormality.

Adrenals/Urinary Tract: No adrenal gland nodules. Kidneys are
symmetrical. Nephrograms are homogeneous. No hydronephrosis or
hydroureter. Bladder wall is mildly thickened, possibly indicating
cystitis. No filling defects or stones identified.

Stomach/Bowel: Stomach is within normal limits. Appendix is not
identified. No evidence of bowel wall thickening, distention, or
inflammatory changes.

Vascular/Lymphatic: No significant vascular findings are present. No
enlarged abdominal or pelvic lymph nodes.

Reproductive: Prostate gland is enlarged, measuring 4.3 cm diameter.
Prostate calcifications are present.

Other: No abdominal wall hernia or abnormality. No abdominopelvic
ascites.

Musculoskeletal: No acute or significant osseous findings.
IMPRESSION: 1. Prominent diffuse fatty infiltration of the liver. No focal liver
lesions identified.
2. Bladder wall thickening, possibly indicating cystitis.
3. No evidence of bowel obstruction or inflammation.
4. Enlarged prostate gland.

## 2019-09-02 ENCOUNTER — Other Ambulatory Visit: Payer: Self-pay

## 2019-09-02 DIAGNOSIS — R7989 Other specified abnormal findings of blood chemistry: Secondary | ICD-10-CM

## 2019-09-02 DIAGNOSIS — R945 Abnormal results of liver function studies: Secondary | ICD-10-CM

## 2019-09-30 ENCOUNTER — Telehealth: Payer: Self-pay | Admitting: *Deleted

## 2019-09-30 NOTE — Telephone Encounter (Signed)
Called patient. His procedure time on 1/28 has been moved up to 8:15am. He was fine with this. He is aware pre-op will let him know what time to arrive. He can have clear liquids until midnight prior to EGD and nothing after. He voiced understanding. Endo aware.

## 2019-10-10 ENCOUNTER — Other Ambulatory Visit: Payer: Self-pay

## 2019-10-10 ENCOUNTER — Other Ambulatory Visit (HOSPITAL_COMMUNITY)
Admission: RE | Admit: 2019-10-10 | Discharge: 2019-10-10 | Disposition: A | Discharge status: Home / Self Care | Payer: Self-pay | Source: Ambulatory Visit | Attending: Gastroenterology | Admitting: Gastroenterology

## 2019-10-10 ENCOUNTER — Encounter: Payer: Self-pay | Admitting: Physician Assistant

## 2019-10-10 ENCOUNTER — Ambulatory Visit: Payer: Self-pay | Admitting: Physician Assistant

## 2019-10-10 VITALS — BP 138/78

## 2019-10-10 DIAGNOSIS — I10 Essential (primary) hypertension: Secondary | ICD-10-CM

## 2019-10-10 DIAGNOSIS — R109 Unspecified abdominal pain: Secondary | ICD-10-CM

## 2019-10-10 DIAGNOSIS — F17200 Nicotine dependence, unspecified, uncomplicated: Secondary | ICD-10-CM

## 2019-10-10 DIAGNOSIS — R945 Abnormal results of liver function studies: Secondary | ICD-10-CM | POA: Insufficient documentation

## 2019-10-10 LAB — HEPATIC FUNCTION PANEL
ALT: 23 U/L (ref 0–44)
AST: 19 U/L (ref 15–41)
Albumin: 4.2 g/dL (ref 3.5–5.0)
Alkaline Phosphatase: 115 U/L (ref 38–126)
Bilirubin, Direct: 0.1 mg/dL (ref 0.0–0.2)
Indirect Bilirubin: 0.6 mg/dL (ref 0.3–0.9)
Total Bilirubin: 0.7 mg/dL (ref 0.3–1.2)
Total Protein: 7.1 g/dL (ref 6.5–8.1)

## 2019-10-10 NOTE — Progress Notes (Signed)
   BP 138/78    Subjective:    Patient ID: Tom Herman, male    DOB: August 25, 1981, 39 y.o.   MRN: [id number]  HPI: Tom Herman is a 39 y.o. male presenting on 10/10/2019 for No chief complaint on file.   HPI  This is a telemedicine appointemnt due to coronavirus panemic.  It is via telephone as pt was unable to connect through [employer].   I connected with  [name] on 10/10/19 by a video enabled telemedicine application and verified that I am speaking with the correct person using two identifiers.   I discussed the limitations of evaluation and management by telemedicine. The patient expressed understanding and agreed to proceed.  Pt is at his father's house.  Provider is at office.     Pt appointment today is to follow up on HTN.  Pt monitors his bp at home.  Readings  today :   136/78    148/96  Pt is feeling okay today but is still feeling Bloated.    No HA or CP.   He is being evaluated by GI and has appointment for EGD next week.       Relevant past medical, surgical, family and social history reviewed and updated as indicated. Interim medical history since our last visit reviewed. Allergies and medications reviewed and updated.   Current Outpatient Medications:  .  Buprenorphine HCl-Naloxone HCl (SUBOXONE SL), Place under the tongue See admin instructions. 1/10 of film 2-3 times daily, Disp: , Rfl:  .  lisinopril (ZESTRIL) 20 MG tablet, Take 1 tablet (20 mg total) by mouth daily. (Patient taking differently: Take 20 mg by mouth daily after lunch. ), Disp: 30 tablet, Rfl: 2 .  omeprazole (PRILOSEC) 20 MG capsule, Take 20 mg by mouth daily before breakfast., Disp: , Rfl:  .  omeprazole (PRILOSEC) 40 MG capsule, Take 1 capsule (40 mg total) by mouth daily before breakfast. (Patient not taking: Reported on 10/09/2019), Disp: 30 capsule, Rfl: 3 .  tetrahydrozoline 0.05 % ophthalmic solution, Place 1 drop into both eyes 3 (three) times daily as needed (dry/irritated  eyes.)., Disp: , Rfl:     Review of Systems  Per HPI unless specifically indicated above     Objective:    BP 138/78   Wt Readings from Last 3 Encounters:  07/29/19 191 lb 12.8 oz (87 kg)  11/20/18 159 lb (72.1 kg)  09/11/18 147 lb 3.2 oz (66.8 kg)    Physical Exam Vitals reviewed.  Pulmonary:     Effort: Pulmonary effort is normal. No respiratory distress.  Neurological:     Mental Status: He is alert and oriented to person, place, and time.  Psychiatric:        Attention and Perception: Attention normal.        Speech: Speech normal.        Behavior: Behavior is cooperative.           Assessment & Plan:    Encounter Diagnoses  Name Primary?  . Essential hypertension Yes  . Tobacco use disorder   . Abdominal pain, unspecified abdominal location     -Discussed increase in blood pressure medication but bpt does not want to at this time.  He Will continue same dosage of lisinopril for now. -pt to continue with GI as scheduled -pt to follow up 6wk in office.  He is to contact office sooner prn

## 2019-10-11 LAB — MITOCHONDRIAL ANTIBODIES: Mitochondrial M2 Ab, IgG: 37.1 Units — ABNORMAL HIGH (ref 0.0–20.0)

## 2019-10-11 NOTE — Patient Instructions (Signed)
17    Your procedure is scheduled on: 10/17/2019  Report to [facility] at 6:45    AM.  Call this number if you have problems the morning of surgery: [phone]   Remember:   Do not drink or eat food:After Midnight.      Take these medicines the morning of surgery with A SIP OF WATER: Omeprazole   Do not wear jewelry, make-up or nail polish.  Do not wear lotions, powders, or perfumes. You may wear deodorant.                Do not bring valuables to the hospital.  Contacts, dentures or bridgework may not be worn into surgery.  Leave suitcase in the car. After surgery it may be brought to your room.  For patients admitted to the hospital, checkout time is 11:00 AM the day of discharge.   Patients discharged the day of surgery will not be allowed to drive home.                                                                                                                                    [facility] After Please read the instructions outlined below and refer to this sheet in the next few weeks. These discharge instructions provide you with general information on caring for yourself after you leave the hospital. Your doctor may also give you specific instructions. While your treatment has been planned according to the most current medical practices available, unavoidable complications occasionally occur. If you have any problems or questions after discharge, please call your doctor. HOME CARE INSTRUCTIONS Activity  You may resume your regular activity but move at a slower pace for the next 24 hours.   Take frequent rest periods for the next 24 hours.   Walking will help expel (get rid of) the air and reduce the bloated feeling in your abdomen.   No driving for 24 hours (because of the anesthesia (medicine) used during the test).   You may shower.   Do not sign any important legal documents or operate any machinery for 24 hours (because of the anesthesia used during the test).   Nutrition  Drink plenty of fluids.   You may resume your normal diet.   Begin with a light meal and progress to your normal diet.   Avoid alcoholic beverages for 24 hours or as instructed by your caregiver.  Medications You may resume your normal medications unless your caregiver tells you otherwise. What you can expect today  You may experience abdominal discomfort such as a feeling of fullness or "gas" pains.   You may experience a sore throat for 2 to 3 days. This is normal. Gargling with salt water may help this.  Follow-up Your doctor will discuss the results of your test with you. SEEK IMMEDIATE MEDICAL CARE IF:  You have excessive nausea (feeling sick to your stomach) and/or vomiting.   You  have severe abdominal pain and distention (swelling).   You have trouble swallowing.   You have a temperature over 100 F (37.8 C).   You have rectal bleeding or vomiting of blood.  Document Released: 04/19/2004 Document Revised: 08/25/2011 Document Reviewed: 10/31/2007

## 2019-10-15 ENCOUNTER — Other Ambulatory Visit (HOSPITAL_COMMUNITY)
Admission: RE | Admit: 2019-10-15 | Discharge: 2019-10-15 | Disposition: A | Discharge status: Home / Self Care | Payer: Self-pay | Source: Ambulatory Visit | Attending: Internal Medicine | Admitting: Internal Medicine

## 2019-10-15 ENCOUNTER — Encounter (HOSPITAL_COMMUNITY)
Admission: RE | Admit: 2019-10-15 | Discharge: 2019-10-15 | Disposition: A | Discharge status: Home / Self Care | Payer: Self-pay | Source: Ambulatory Visit | Attending: Internal Medicine | Admitting: Internal Medicine

## 2019-10-15 ENCOUNTER — Other Ambulatory Visit: Payer: Self-pay

## 2019-10-15 ENCOUNTER — Encounter (HOSPITAL_COMMUNITY): Payer: Self-pay

## 2019-10-15 DIAGNOSIS — Z20822 Contact with and (suspected) exposure to covid-19: Secondary | ICD-10-CM | POA: Insufficient documentation

## 2019-10-15 DIAGNOSIS — Z01812 Encounter for preprocedural laboratory examination: Secondary | ICD-10-CM | POA: Insufficient documentation

## 2019-10-15 LAB — BASIC METABOLIC PANEL
Anion gap: 8 (ref 5–15)
BUN: 11 mg/dL (ref 6–20)
CO2: 23 mmol/L (ref 22–32)
Calcium: 9.1 mg/dL (ref 8.9–10.3)
Chloride: 104 mmol/L (ref 98–111)
Creatinine, Ser: 0.7 mg/dL (ref 0.61–1.24)
GFR calc Af Amer: >60 mL/min (ref >60)
GFR calc non Af Amer: >60 mL/min (ref >60)
Glucose, Bld: 132 mg/dL — ABNORMAL HIGH (ref 70–99)
Potassium: 3.7 mmol/L (ref 3.5–5.1)
Sodium: 135 mmol/L (ref 135–145)

## 2019-10-15 LAB — PROTIME-INR
INR: 1 (ref 0.8–1.2)
Prothrombin Time: 13.1 seconds (ref 11.4–15.2)

## 2019-10-15 LAB — SARS CORONAVIRUS 2 (TAT 6-24 HRS): SARS Coronavirus 2: NEGATIVE

## 2019-10-16 ENCOUNTER — Encounter: Payer: Self-pay | Admitting: Internal Medicine

## 2019-10-16 NOTE — Progress Notes (Signed)
PATIENT SCHEDULED AND LETTER SENT  °

## 2019-10-17 ENCOUNTER — Ambulatory Visit (HOSPITAL_COMMUNITY): Payer: Self-pay | Admitting: Anesthesiology

## 2019-10-17 ENCOUNTER — Ambulatory Visit (HOSPITAL_COMMUNITY)
Admission: RE | Admit: 2019-10-17 | Discharge: 2019-10-17 | Disposition: A | Discharge status: Home / Self Care | Payer: Self-pay | Attending: Internal Medicine | Admitting: Internal Medicine

## 2019-10-17 ENCOUNTER — Encounter (HOSPITAL_COMMUNITY)
Admission: RE | Disposition: A | Discharge status: Home / Self Care | Payer: Self-pay | Source: Home / Self Care | Attending: Internal Medicine

## 2019-10-17 ENCOUNTER — Other Ambulatory Visit: Payer: Self-pay

## 2019-10-17 ENCOUNTER — Encounter (HOSPITAL_COMMUNITY): Payer: Self-pay | Admitting: Internal Medicine

## 2019-10-17 DIAGNOSIS — K2100 Gastro-esophageal reflux disease with esophagitis, without bleeding: Secondary | ICD-10-CM | POA: Insufficient documentation

## 2019-10-17 DIAGNOSIS — Z79899 Other long term (current) drug therapy: Secondary | ICD-10-CM | POA: Insufficient documentation

## 2019-10-17 DIAGNOSIS — R1010 Upper abdominal pain, unspecified: Secondary | ICD-10-CM

## 2019-10-17 DIAGNOSIS — K449 Diaphragmatic hernia without obstruction or gangrene: Secondary | ICD-10-CM | POA: Insufficient documentation

## 2019-10-17 DIAGNOSIS — K209 Esophagitis, unspecified without bleeding: Secondary | ICD-10-CM

## 2019-10-17 DIAGNOSIS — R1011 Right upper quadrant pain: Secondary | ICD-10-CM | POA: Insufficient documentation

## 2019-10-17 DIAGNOSIS — F419 Anxiety disorder, unspecified: Secondary | ICD-10-CM | POA: Insufficient documentation

## 2019-10-17 DIAGNOSIS — R1013 Epigastric pain: Secondary | ICD-10-CM | POA: Insufficient documentation

## 2019-10-17 DIAGNOSIS — F17210 Nicotine dependence, cigarettes, uncomplicated: Secondary | ICD-10-CM | POA: Insufficient documentation

## 2019-10-17 DIAGNOSIS — F329 Major depressive disorder, single episode, unspecified: Secondary | ICD-10-CM | POA: Insufficient documentation

## 2019-10-17 HISTORY — DX: Gastro-esophageal reflux disease without esophagitis: K21.9

## 2019-10-17 HISTORY — PX: ESOPHAGOGASTRODUODENOSCOPY (EGD) WITH PROPOFOL: SHX5813

## 2019-10-17 SURGERY — ESOPHAGOGASTRODUODENOSCOPY (EGD) WITH PROPOFOL
Anesthesia: General

## 2019-10-17 MED ORDER — HYDROCODONE-ACETAMINOPHEN 7.5-325 MG PO TABS: 1.0000 | ORAL_TABLET | Freq: Once | ORAL | Status: DC | PRN

## 2019-10-17 MED ORDER — CHLORHEXIDINE GLUCONATE CLOTH 2 % EX PADS: 6.0000 | MEDICATED_PAD | Freq: Once | CUTANEOUS | Status: DC

## 2019-10-17 MED ORDER — PROPOFOL 10 MG/ML IV BOLUS
INTRAVENOUS | Status: AC
  Filled 2019-10-17: qty 40

## 2019-10-17 MED ORDER — LIDOCAINE HCL (CARDIAC) PF 100 MG/5ML IV SOSY
PREFILLED_SYRINGE | INTRAVENOUS | Status: DC | PRN
  Administered 2019-10-17: 60 mg via INTRAVENOUS

## 2019-10-17 MED ORDER — HYDROMORPHONE HCL 1 MG/ML IJ SOLN: 0.2500 mg | INTRAMUSCULAR | Status: DC | PRN

## 2019-10-17 MED ORDER — MIDAZOLAM HCL 5 MG/5ML IJ SOLN
INTRAMUSCULAR | Status: DC | PRN
  Administered 2019-10-17: 2 mg via INTRAVENOUS

## 2019-10-17 MED ORDER — PROMETHAZINE HCL 25 MG/ML IJ SOLN: 6.2500 mg | INTRAMUSCULAR | Status: DC | PRN

## 2019-10-17 MED ORDER — STERILE WATER FOR IRRIGATION IR SOLN
Status: DC | PRN
  Administered 2019-10-17: 08:00:00 1.5 mL

## 2019-10-17 MED ORDER — LIDOCAINE 2% (20 MG/ML) 5 ML SYRINGE
INTRAMUSCULAR | Status: AC
  Filled 2019-10-17: qty 5

## 2019-10-17 MED ORDER — FENTANYL CITRATE (PF) 100 MCG/2ML IJ SOLN
INTRAMUSCULAR | Status: DC | PRN
  Administered 2019-10-17: 100 ug via INTRAVENOUS

## 2019-10-17 MED ORDER — LACTATED RINGERS IV SOLN: INTRAVENOUS | Status: DC

## 2019-10-17 MED ORDER — PROPOFOL 500 MG/50ML IV EMUL
INTRAVENOUS | Status: DC | PRN
  Administered 2019-10-17: 200 ug/kg/min via INTRAVENOUS

## 2019-10-17 MED ORDER — MIDAZOLAM HCL 2 MG/2ML IJ SOLN: 0.5000 mg | Freq: Once | INTRAMUSCULAR | Status: DC | PRN

## 2019-10-17 MED ORDER — FENTANYL CITRATE (PF) 100 MCG/2ML IJ SOLN
INTRAMUSCULAR | Status: AC
  Filled 2019-10-17: qty 2

## 2019-10-17 MED ORDER — MIDAZOLAM HCL 2 MG/2ML IJ SOLN
INTRAMUSCULAR | Status: AC
  Filled 2019-10-17: qty 2

## 2019-10-17 MED ORDER — LACTATED RINGERS IV SOLN: INTRAVENOUS | Status: DC | PRN

## 2019-10-17 NOTE — Transfer of Care (Signed)
Immediate Anesthesia Transfer of Care Note  Patient: Tom Herman  Procedure(s) Performed: ESOPHAGOGASTRODUODENOSCOPY (EGD) WITH PROPOFOL (N/A )  Patient Location: PACU  Anesthesia Type:MAC  Level of Consciousness: awake, alert , oriented and patient cooperative  Airway & Oxygen Therapy: Patient Spontanous Breathing  Post-op Assessment: Report given to RN and Post -op Vital signs reviewed and stable  Post vital signs: Reviewed and stable  Last Vitals:  Vitals Value Taken Time  BP    Temp    Pulse 86 10/17/19 0830  Resp 22 10/17/19 0830  SpO2 96 % 10/17/19 0830  Vitals shown include unvalidated device data.  Last Pain:  Vitals:   10/17/19 0818  TempSrc:   PainSc: 0-No pain      Patients Stated Pain Goal: 8 (25/75/05 1833)  Complications: No apparent anesthesia complications

## 2019-10-17 NOTE — Anesthesia Preprocedure Evaluation (Signed)
Anesthesia Evaluation  Patient identified by MRN, date of birth, ID band Patient awake    Reviewed: Allergy & Precautions, NPO status , Patient's Chart, lab work & pertinent test results  Airway Mallampati: II  TM Distance: >3 FB Neck ROM: Full    Dental no notable dental hx. (+) Poor Dentition, Loose   Pulmonary neg pulmonary ROS, Current SmokerPatient did not abstain from smoking.,    Pulmonary exam normal breath sounds clear to auscultation       Cardiovascular Exercise Tolerance: Good negative cardio ROS Normal cardiovascular examI Rhythm:Regular Rate:Normal  RBBB noted on 2019 ECG Reports excellent ET Denies CP/DOE   Neuro/Psych Anxiety Depression negative neurological ROS  negative psych ROS   GI/Hepatic GERD  Medicated and Controlled,(+)     substance abuse  , Hepatitis -, Toxin RelatedH/o Etoh liver Disease  States no Etoh since ~ June  Here for EGD   Endo/Other  negative endocrine ROS  Renal/GU negative Renal ROS  negative genitourinary   Musculoskeletal negative musculoskeletal ROS (+)   Abdominal   Peds negative pediatric ROS (+)  Hematology negative hematology ROS (+)   Anesthesia Other Findings   Reproductive/Obstetrics negative OB ROS                             Anesthesia Physical Anesthesia Plan  ASA: II  Anesthesia Plan: General   Post-op Pain Management:    Induction: Intravenous  PONV Risk Score and Plan: 1 and Propofol infusion, TIVA and Treatment may vary due to age or medical condition  Airway Management Planned: Simple Face Mask and Nasal Cannula  Additional Equipment:   Intra-op Plan:   Post-operative Plan:   Informed Consent: I have reviewed the patients History and Physical, chart, labs and discussed the procedure including the risks, benefits and alternatives for the proposed anesthesia with the patient or authorized representative who has  indicated his/her understanding and acceptance.     Dental advisory given  Plan Discussed with: CRNA  Anesthesia Plan Comments: (Plan Full PPE use  Plan GA with GETA as needed d/w pt -WTP with same after Q&A)        Anesthesia Quick Evaluation

## 2019-10-17 NOTE — Op Note (Signed)
[facility] Patient Name: Tom Herman Procedure Date: 10/17/2019 7:53 AM MRN: [id number] Date of Birth: [birth year] Attending MD: Norvel Richards , MD CSN: [id number] Age: 39 Admit Type: Outpatient Procedure:                Upper GI endoscopy Indications:              Epigastric abdominal pain Providers:                Norvel Richards, MD, Charlsie Quest. Theda Sers RN, RN,                            Raphael Gibney, Technician Referring MD:              Medicines:                Propofol per Anesthesia Complications:            No immediate complications. Estimated Blood Loss:     Estimated blood loss: none. Procedure:                Pre-Anesthesia Assessment:                           - Prior to the procedure, a History and Physical                            was performed, and patient medications and                            allergies were reviewed. The patient's tolerance of                            previous anesthesia was also reviewed. The risks                            and benefits of the procedure and the sedation                            options and risks were discussed with the patient.                            All questions were answered, and informed consent                            was obtained. Prior Anticoagulants: The patient has                            taken no previous anticoagulant or antiplatelet                            agents. ASA Grade Assessment: II - A patient with                            mild systemic disease. After reviewing the risks  and benefits, the patient was deemed in                            satisfactory condition to undergo the procedure.                           After obtaining informed consent, the endoscope was                            passed under direct vision. Throughout the                            procedure, the patient's blood pressure, pulse, and                            oxygen  saturations were monitored continuously. The                            [id number]-[id number] ([id number]) was introduced through the                            mouth, and advanced to the second part of duodenum.                            The upper GI endoscopy was accomplished without                            difficulty. The patient tolerated the procedure                            well. Scope In: 8:19:15 AM Scope Out: 8:23:20 AM Total Procedure Duration: 0 hours 4 minutes 5 seconds  Findings:      Esophagitis was found. Multiple erosions within 5 mm the GE junction. No       Barrett's epithelium seen. No esophageal varices.      A small hiatal hernia was present.      The exam was otherwise without abnormality.      The duodenal bulb and second portion of the duodenum were normal. Impression:               -Erosive reflux esophagitis.                           - Small hiatal hernia.                           - The examination was otherwise normal.                           - Normal duodenal bulb and second portion of the                            duodenum.                           - No specimens collected. Moderate Sedation:  Moderate (conscious) sedation was personally administered by an       anesthesia professional. The following parameters were monitored: oxygen       saturation, heart rate, blood pressure, respiratory rate, EKG, adequacy       of pulmonary ventilation, and response to care. Recommendation:           - Patient has a contact number available for                            emergencies. The signs and symptoms of potential                            delayed complications were discussed with the                            patient. Return to normal activities tomorrow.                            Written discharge instructions were provided to the                            patient.                           - Resume previous diet.                           - Continue  present medications. Stop as needed                            omeprazole; begin Protonix 40 mg every day                           - Return to GI clinic in 3 months. No more alcohol. Procedure Code(s):        --- Professional ---                           [id number], Esophagogastroduodenoscopy, flexible,                            transoral; diagnostic, including collection of                            specimen(s) by brushing or washing, when performed                            (separate procedure) Diagnosis Code(s):        --- Professional ---                           K20.90, Esophagitis, unspecified without bleeding                           K44.9, Diaphragmatic hernia without obstruction or  gangrene                           R10.13, Epigastric pain CPT copyright 2019 [employer]. All rights reserved. The codes documented in this report are preliminary and upon coder review may  be revised to meet current compliance requirements. Gerrit Friends. Airica Schwartzkopf, MD Gennette Pac, MD 10/17/2019 8:35:51 AM This report has been signed electronically. Number of Addenda: 0
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
## 2019-10-17 NOTE — Anesthesia Postprocedure Evaluation (Signed)
Anesthesia Post Note  Patient: Tom Herman  Procedure(s) Performed: ESOPHAGOGASTRODUODENOSCOPY (EGD) WITH PROPOFOL (N/A )  Patient location during evaluation: PACU Anesthesia Type: MAC Level of consciousness: awake, oriented, awake and alert and patient cooperative Pain management: pain level controlled Vital Signs Assessment: post-procedure vital signs reviewed and stable Respiratory status: spontaneous breathing, respiratory function stable and nonlabored ventilation Postop Assessment: no apparent nausea or vomiting Anesthetic complications: no     Last Vitals:  Vitals:   10/17/19 0708 10/17/19 0830  BP: (!) 133/91 120/61  Pulse: 89 86  Resp: 13 (!) 22  Temp: 37.1 C (P) 36.9 C  SpO2: 100% 96%    Last Pain:  Vitals:   10/17/19 0818  TempSrc:   PainSc: 0-No pain                 Arianna Haydon

## 2019-10-17 NOTE — Discharge Instructions (Signed)

## 2019-11-19 ENCOUNTER — Ambulatory Visit: Payer: HRSA Program | Attending: Internal Medicine

## 2019-11-19 ENCOUNTER — Encounter: Payer: Self-pay | Admitting: Physician Assistant

## 2019-11-19 ENCOUNTER — Other Ambulatory Visit: Payer: Self-pay

## 2019-11-19 ENCOUNTER — Ambulatory Visit: Payer: Self-pay | Admitting: Physician Assistant

## 2019-11-19 DIAGNOSIS — Z20822 Contact with and (suspected) exposure to covid-19: Secondary | ICD-10-CM | POA: Insufficient documentation

## 2019-11-19 DIAGNOSIS — R52 Pain, unspecified: Secondary | ICD-10-CM

## 2019-11-19 NOTE — Progress Notes (Signed)
   There were no vitals taken for this visit.   Subjective:    Patient ID: Tom Herman, male    DOB: 10/03/1980, 39 y.o.   MRN: [id number]  HPI: Tom Herman is a 39 y.o. male presenting on 11/19/2019 for No chief complaint on file.   HPI   tHis is a telemedicine appointment through [employer] due to coronavirus pandemic.  Tom Herman was Scheduled for in-office appointment today but was changed to virtual due to Tom Herman reported new onset body aches.  I connected with  Tom Herman on 11/19/19 by a video enabled telemedicine application and verified that I am speaking with the correct person using two identifiers.   I discussed the limitations of evaluation and management by telemedicine. The patient expressed understanding and agreed to proceed.  Tom Herman is in his parked car in office parking lot.  Provider is in office.    Tom Herman is 38yoM with HTN.  He reports New body aches.  He allso has fever blisters.  No cough, no fever.    No known exposure but is staying with a frineds house -    He sees GI for "stomach stuff"   Relevant past medical, surgical, family and social history reviewed and updated as indicated. Interim medical history since our last visit reviewed. Allergies and medications reviewed and updated.   Current Outpatient Medications:  .  lisinopril (ZESTRIL) 20 MG tablet, Take 1 tablet (20 mg total) by mouth daily. (Patient taking differently: Take 20 mg by mouth daily after lunch. ), Disp: 30 tablet, Rfl: 2 .  omeprazole (PRILOSEC) 20 MG capsule, Take 20 mg by mouth daily before breakfast., Disp: , Rfl:  .  tetrahydrozoline 0.05 % ophthalmic solution, Place 1 drop into both eyes 3 (three) times daily as needed (dry/irritated eyes.)., Disp: , Rfl:    Review of Systems  Per HPI unless specifically indicated above     Objective:    There were no vitals taken for this visit.  Wt Readings from Last 3 Encounters:  10/17/19 180 lb (81.6 kg)  10/15/19 180 lb (81.6 kg)   07/29/19 191 lb 12.8 oz (87 kg)    Physical Exam Constitutional:      Appearance: He is not toxic-appearing.  HENT:     Head: Normocephalic and atraumatic.  Pulmonary:     Effort: Pulmonary effort is normal. No respiratory distress.  Neurological:     Mental Status: He is alert and oriented to person, place, and time.  Psychiatric:        Attention and Perception: Attention normal.        Speech: Speech normal.        Behavior: Behavior is cooperative.         Assessment & Plan:   Encounter Diagnoses  Name Primary?  . Suspected COVID-19 virus infection Yes  . Body aches     -Tom Herman is scheduled for covid test -Tom Herman is Counseled to self- isolate until he gets his test results -Tom Herman to follow up 1 month.  He is to contact office sooner prn

## 2019-11-20 LAB — NOVEL CORONAVIRUS, NAA: SARS-CoV-2, NAA: NOT DETECTED

## 2019-11-21 ENCOUNTER — Telehealth: Payer: Self-pay | Admitting: Student

## 2019-11-21 NOTE — Telephone Encounter (Signed)
LPN called and notified pt that COVID test came back as NOT DETECTED. Pt verbalized understanding.

## 2019-11-26 ENCOUNTER — Ambulatory Visit: Payer: Self-pay | Admitting: Internal Medicine

## 2019-12-19 ENCOUNTER — Other Ambulatory Visit: Payer: Self-pay | Admitting: Physician Assistant

## 2019-12-25 ENCOUNTER — Encounter: Payer: Self-pay | Admitting: Physician Assistant

## 2019-12-25 ENCOUNTER — Ambulatory Visit: Payer: Self-pay | Admitting: Physician Assistant

## 2019-12-25 VITALS — BP 120/84 | HR 96 | Temp 98.8°F | Ht 71.0 in | Wt 192.8 lb

## 2019-12-25 DIAGNOSIS — I10 Essential (primary) hypertension: Secondary | ICD-10-CM

## 2019-12-25 DIAGNOSIS — F17200 Nicotine dependence, unspecified, uncomplicated: Secondary | ICD-10-CM

## 2019-12-25 NOTE — Patient Instructions (Signed)
Covid Vaccination appointment - Thursday January 02, 2020 - 2:15pm

## 2019-12-25 NOTE — Progress Notes (Signed)
   BP 120/84   Pulse 96   Temp 98.8 F (37.1 C)   Ht 5\' 11"  (1.803 m)   Wt 192 lb 12.8 oz (87.5 kg)   SpO2 100%   BMI 26.89 kg/m    Subjective:    Patient ID: Tom Herman, male    DOB: [birth year], 39 y.o.   MRN: [id number]  HPI: AKASHDEEP CHUBA is a 39 y.o. male presenting on 12/25/2019 for No chief complaint on file.   HPI  Pt had negative covid 19 screening questionnaire.   Pt is 38yoM with follow up for HTN today.  Pt doing okay.  No complaints  He has f/u with GI later this month     Relevant past medical, surgical, family and social history reviewed and updated as indicated. Interim medical history since our last visit reviewed. Allergies and medications reviewed and updated.    Current Outpatient Medications:  .  lisinopril (ZESTRIL) 20 MG tablet, Take 1 tablet by mouth once daily, Disp: 30 tablet, Rfl: 1    Review of Systems  Per HPI unless specifically indicated above     Objective:    BP 120/84   Pulse 96   Temp 98.8 F (37.1 C)   Ht 5\' 11"  (1.803 m)   Wt 192 lb 12.8 oz (87.5 kg)   SpO2 100%   BMI 26.89 kg/m   Wt Readings from Last 3 Encounters:  12/25/19 192 lb 12.8 oz (87.5 kg)  10/17/19 180 lb (81.6 kg)  10/15/19 180 lb (81.6 kg)    Physical Exam Vitals reviewed.  Constitutional:      General: He is not in acute distress.    Appearance: Normal appearance. He is well-developed. He is not ill-appearing.  HENT:     Head: Normocephalic and atraumatic.  Cardiovascular:     Rate and Rhythm: Normal rate and regular rhythm.  Pulmonary:     Effort: Pulmonary effort is normal.     Breath sounds: Normal breath sounds. No wheezing.  Abdominal:     General: Bowel sounds are normal.     Palpations: Abdomen is soft.     Tenderness: There is no abdominal tenderness.  Musculoskeletal:     Cervical back: Neck supple.     Right lower leg: No edema.     Left lower leg: No edema.  Lymphadenopathy:     Cervical: No cervical adenopathy.   Skin:    General: Skin is warm and dry.  Neurological:     Mental Status: He is alert and oriented to person, place, and time.  Psychiatric:        Attention and Perception: Attention normal.        Speech: Speech normal.        Behavior: Behavior normal.         Assessment & Plan:    Encounter Diagnoses  Name Primary?  . Essential hypertension Yes  . Tobacco use disorder       Pt to continue current medications  He is scheduled for covid vaccination  He is encouraged on smoking cessation  Pt to follow up in 3 months.  He is to contact office sooner prn

## 2020-01-15 ENCOUNTER — Other Ambulatory Visit: Payer: Self-pay

## 2020-01-15 ENCOUNTER — Encounter: Payer: Self-pay | Admitting: Gastroenterology

## 2020-01-15 ENCOUNTER — Ambulatory Visit (INDEPENDENT_AMBULATORY_CARE_PROVIDER_SITE_OTHER): Payer: Self-pay | Admitting: Gastroenterology

## 2020-01-15 VITALS — BP 136/86 | HR 98 | Temp 97.1°F | Ht 71.0 in | Wt 191.8 lb

## 2020-01-15 DIAGNOSIS — K219 Gastro-esophageal reflux disease without esophagitis: Secondary | ICD-10-CM | POA: Insufficient documentation

## 2020-01-15 DIAGNOSIS — R7989 Other specified abnormal findings of blood chemistry: Secondary | ICD-10-CM

## 2020-01-15 DIAGNOSIS — R6881 Early satiety: Secondary | ICD-10-CM

## 2020-01-15 DIAGNOSIS — K2100 Gastro-esophageal reflux disease with esophagitis, without bleeding: Secondary | ICD-10-CM

## 2020-01-15 DIAGNOSIS — R945 Abnormal results of liver function studies: Secondary | ICD-10-CM

## 2020-01-15 NOTE — Assessment & Plan Note (Signed)
Persistent early satiety.  Intermittent heartburn.  EGD in January showed erosive reflux esophagitis but otherwise unremarkable.  He states he was unable to obtain pantoprazole due to the expense.  Previously has used omeprazole off and on for couple weeks at a time but this has not really helped his early satiety.  We will reach out to the free clinic for assistance in obtaining chronic PPI.  Would like for him to take for 2 to 3 months to see if this helps with his upper GI symptoms.  Patient does have a history of prominent spleen on abdominal ultrasound which could be contributing to his early satiety.

## 2020-01-15 NOTE — Progress Notes (Signed)
Cc'ed to pcp °

## 2020-01-15 NOTE — Patient Instructions (Signed)
1. We will be in touch with further recommendations regarding possible liver biopsy in the future.  2. We need you on a medication for your reflux daily for two months to see if this helps your sensation of feeling full early with meals. We will call the [facility] to see if they can help with obtaining supply of medication for you.

## 2020-01-15 NOTE — Assessment & Plan Note (Signed)
History of alcohol abuse with alcoholic hepatitis in 2019.  Currently alcohol abuse in remission.  His LFTs were normal in January.  He has had AMA positive twice.  Clinical significance unclear at this time.  To discuss further management with Dr. Jena Gauss.  Potentially continue to follow LFTs versus liver biopsy to rule out PBC.

## 2020-01-15 NOTE — Progress Notes (Signed)
Primary Care Physician: Soyla Dryer, PA-C  Primary Gastroenterologist:  Garfield Cornea, MD   Chief Complaint  Patient presents with  . Abdominal Pain    RUQ pain if he eats a lot or full meal  . Bloated    with eating a lot    HPI: Tom Herman is a 39 y.o. male here for follow-up.  He was seen back in November 2020.  He has a history of alcohol abuse, alcoholic hepatitis, chronic right upper quadrant pain, initially seen for the above in 2019.   At time of last office visit he had gained 45 pounds over the course of the year.  He contributed this to him cutting way back on alcohol use. He underwent EGD in January 2021, found to have erosive reflux esophagitis.  His omeprazole was switched to pantoprazole 40 mg daily. Labs in January 2021: LFTs normal, AMA positive.  He was encouraged to have hepatitis A and B vaccinations.  His HCVRNA was negative.  Hospitalized in August 6834 for alcoholic hepatitis with edema/testicular swelling.  History of excessive alcohol abuse especially in 2019 after his father suffered a stroke.  Patient previously declined colonoscopy for hematochezia.  Today patient states he is not on pantoprazole, could not afford. Whenever he feels heartburn, he will take OTC omeprazole daily for a couple of weeks.  This will seem to calm things down.  Seems to tolerate spicy foods but limits.  Heartburn seems to be dairy related.  No dysphagia. No vomiting.  Biggest concern of persistent early satiety.  No nausea or vomiting.  Has to take his time eating.  Eat smaller portions. Right rib pain, mainly with strenuous activity or bending over. No meal related. BM regular. No melena, brbpr. Weight stable. One beer 10/2019. No cravings. Living with brother right now. Still helping with his Dad daily.    Previous imaging: August 2019-right upper quadrant ultrasound with increased echotexture of the liver compatible with fatty liver, liver appear prominent in size  with possible hepatomegaly.  No cholelithiasis or cholecystitis.  August 2019-CT abdomen and pelvis with contrast with prominent diffuse fatty infiltration of the liver.  Gallbladder somewhat distended but no wall thickening or inflammatory process.  No stones.  Pancreas unremarkable.  Enlarged prostate gland measuring 2.3 cm with calcifications.  Possible cystitis.  December 2019-right upper quadrant ultrasound normal  Abdominal ultrasound November 2020: Prominent spleen.  Hepatic steatosis.  Current Outpatient Medications  Medication Sig Dispense Refill  . lisinopril (ZESTRIL) 20 MG tablet Take 1 tablet by mouth once daily 30 tablet 1   No current facility-administered medications for this visit.    Allergies as of 01/15/2020  . (No Known Allergies)    ROS:  General: Negative for anorexia, weight loss, fever, chills, fatigue, weakness. ENT: Negative for hoarseness, difficulty swallowing , nasal congestion. CV: Negative for chest pain, angina, palpitations, dyspnea on exertion, peripheral edema.  Respiratory: Negative for dyspnea at rest, dyspnea on exertion, cough, sputum, wheezing.  GI: See history of present illness. GU:  Negative for dysuria, hematuria, urinary incontinence, urinary frequency, nocturnal urination.  Endo: Negative for unusual weight change.    Physical Examination:   BP 136/86   Pulse 98   Temp (!) 97.1 F (36.2 C) (Oral)   Ht 5\' 11"  (1.803 m)   Wt 191 lb 12.8 oz (87 kg)   BMI 26.75 kg/m   General: Well-nourished, well-developed in no acute distress.  Eyes: No icterus. Mouth: masked Lungs: Clear to  auscultation bilaterally.  Heart: Regular rate and rhythm, no murmurs rubs or gallops.  Abdomen: Bowel sounds are normal, nontender, nondistended, no hepatosplenomegaly or masses, no abdominal bruits or hernia , no rebound or guarding.   Extremities: No lower extremity edema. No clubbing or deformities. Neuro: Alert and oriented x 4   Skin: Warm and  dry, no jaundice.   Psych: Alert and cooperative, normal mood and affect.  Labs:  Lab Results  Component Value Date   CREATININE 0.70 10/15/2019   BUN 11 10/15/2019   NA 135 10/15/2019   K 3.7 10/15/2019   CL 104 10/15/2019   CO2 23 10/15/2019   Lab Results  Component Value Date   ALT 23 10/10/2019   AST 19 10/10/2019   ALKPHOS 115 10/10/2019   BILITOT 0.7 10/10/2019   Lab Results  Component Value Date   WBC 8.4 08/05/2019   HGB 16.1 08/05/2019   HCT 47.2 08/05/2019   MCV 92.4 08/05/2019   PLT 204 08/05/2019   Lab Results  Component Value Date   INR 1.0 10/15/2019   INR 1.1 08/05/2019   INR 1.0 07/15/2019     Imaging Studies: No results found.

## 2020-01-23 ENCOUNTER — Telehealth: Payer: Self-pay | Admitting: Gastroenterology

## 2020-01-23 DIAGNOSIS — R945 Abnormal results of liver function studies: Secondary | ICD-10-CM

## 2020-01-23 DIAGNOSIS — R7989 Other specified abnormal findings of blood chemistry: Secondary | ICD-10-CM

## 2020-01-23 NOTE — Telephone Encounter (Signed)
1. Please let pt know that I spoke with Dr. Jena Gauss and [facility] liver care. Right now we are going to hold off on liver biopsy (since LFTs were normal last time) and monitor things with labs. Please have him go for LFTs in the next couple of weeks. LFTs only. IF LFTs start to rise we may consider liver biopsy in the future.  2. And for his PPI, I spoke to [place] at the [facility]. She can get him free omeprazole but he will need to go by there to get it set up. I would like to do this instead of trying to get pantoprazole from the health dept.  3. Let's have him get omeprazole 40 mg daily before a meal OR 20mg  BID before meals. Whichever they have.  4. Return ov in 3 months.

## 2020-01-23 NOTE — Telephone Encounter (Signed)
Tried calling pt. VM is full. Will call pt back.  

## 2020-01-29 ENCOUNTER — Other Ambulatory Visit: Payer: Self-pay

## 2020-01-29 DIAGNOSIS — R7989 Other specified abnormal findings of blood chemistry: Secondary | ICD-10-CM

## 2020-01-29 NOTE — Telephone Encounter (Signed)
Spoke with pt. Pt is going to have labs done at [facility] next week. Orders will be faxed. Pt has been to the free clinic and they are working on getting pts Omeprazole. Pt was scheduled to come back in 3 months 05/01/20 @ 10:30 AM.

## 2020-02-13 ENCOUNTER — Other Ambulatory Visit (HOSPITAL_COMMUNITY)
Admission: RE | Admit: 2020-02-13 | Discharge: 2020-02-13 | Disposition: A | Discharge status: Home / Self Care | Payer: Self-pay | Source: Ambulatory Visit | Attending: Gastroenterology | Admitting: Gastroenterology

## 2020-02-13 DIAGNOSIS — R945 Abnormal results of liver function studies: Secondary | ICD-10-CM | POA: Insufficient documentation

## 2020-02-13 LAB — HEPATIC FUNCTION PANEL
ALT: 24 U/L (ref 0–44)
AST: 17 U/L (ref 15–41)
Albumin: 4.6 g/dL (ref 3.5–5.0)
Alkaline Phosphatase: 110 U/L (ref 38–126)
Bilirubin, Direct: 0.1 mg/dL (ref 0.0–0.2)
Indirect Bilirubin: 1 mg/dL — ABNORMAL HIGH (ref 0.3–0.9)
Total Bilirubin: 1.1 mg/dL (ref 0.3–1.2)
Total Protein: 7.9 g/dL (ref 6.5–8.1)

## 2020-02-19 ENCOUNTER — Other Ambulatory Visit: Payer: Self-pay | Admitting: *Deleted

## 2020-02-19 DIAGNOSIS — R7989 Other specified abnormal findings of blood chemistry: Secondary | ICD-10-CM

## 2020-03-09 ENCOUNTER — Other Ambulatory Visit: Payer: Self-pay | Admitting: Physician Assistant

## 2020-03-09 DIAGNOSIS — R7309 Other abnormal glucose: Secondary | ICD-10-CM

## 2020-03-09 DIAGNOSIS — I10 Essential (primary) hypertension: Secondary | ICD-10-CM

## 2020-03-09 DIAGNOSIS — Z131 Encounter for screening for diabetes mellitus: Secondary | ICD-10-CM

## 2020-03-14 ENCOUNTER — Other Ambulatory Visit: Payer: Self-pay | Admitting: Physician Assistant

## 2020-03-27 ENCOUNTER — Other Ambulatory Visit (HOSPITAL_COMMUNITY)
Admission: RE | Admit: 2020-03-27 | Discharge: 2020-03-27 | Disposition: A | Discharge status: Home / Self Care | Payer: Self-pay | Source: Ambulatory Visit | Attending: Physician Assistant | Admitting: Physician Assistant

## 2020-03-27 DIAGNOSIS — I10 Essential (primary) hypertension: Secondary | ICD-10-CM | POA: Insufficient documentation

## 2020-03-27 DIAGNOSIS — Z131 Encounter for screening for diabetes mellitus: Secondary | ICD-10-CM | POA: Insufficient documentation

## 2020-03-27 DIAGNOSIS — R7309 Other abnormal glucose: Secondary | ICD-10-CM | POA: Insufficient documentation

## 2020-03-27 LAB — BASIC METABOLIC PANEL
Anion gap: 10 (ref 5–15)
BUN: 12 mg/dL (ref 6–20)
CO2: 24 mmol/L (ref 22–32)
Calcium: 8.8 mg/dL — ABNORMAL LOW (ref 8.9–10.3)
Chloride: 101 mmol/L (ref 98–111)
Creatinine, Ser: 0.87 mg/dL (ref 0.61–1.24)
GFR calc Af Amer: >60 mL/min (ref >60)
GFR calc non Af Amer: >60 mL/min (ref >60)
Glucose, Bld: 95 mg/dL (ref 70–99)
Potassium: 3.5 mmol/L (ref 3.5–5.1)
Sodium: 135 mmol/L (ref 135–145)

## 2020-03-27 LAB — HEMOGLOBIN A1C
Hgb A1c MFr Bld: 4.9 % (ref 4.8–5.6)
Mean Plasma Glucose: 93.93 mg/dL

## 2020-03-30 ENCOUNTER — Other Ambulatory Visit: Payer: Self-pay

## 2020-03-30 ENCOUNTER — Encounter: Payer: Self-pay | Admitting: Physician Assistant

## 2020-03-30 ENCOUNTER — Ambulatory Visit: Payer: Self-pay | Admitting: Physician Assistant

## 2020-03-30 VITALS — BP 130/90 | HR 81 | Temp 98.2°F | Ht 71.0 in | Wt 186.3 lb

## 2020-03-30 DIAGNOSIS — I10 Essential (primary) hypertension: Secondary | ICD-10-CM

## 2020-03-30 DIAGNOSIS — F17200 Nicotine dependence, unspecified, uncomplicated: Secondary | ICD-10-CM

## 2020-03-30 MED ORDER — LISINOPRIL 20 MG PO TABS: 20.0000 mg | ORAL_TABLET | Freq: Every day | ORAL | 3 refills | Status: DC

## 2020-03-30 NOTE — Progress Notes (Signed)
BP 130/90    Pulse 81    Temp 98.2 F (36.8 C)    Ht 5\' 11"  (1.803 m)    Wt 186 lb 4.8 oz (84.5 kg)    SpO2 98%    BMI 25.98 kg/m    Subjective:    Patient ID: Tom Herman, male    DOB: September 15, 1981, 39 y.o.   MRN: [id number]  HPI: Tom Herman is a 39 y.o. male presenting on 03/30/2020 for Hypertension   HPI     Pt had a negative covid 19 screening questionnaire.   Pt is 38yoM with appointment today to follow up on HTN.    Pt got his 1st covid vaccination dose.  He missed his 2nd dose.    He says he is doing well and has no complaints today.    Relevant past medical, surgical, family and social history reviewed and updated as indicated. Interim medical history since our last visit reviewed. Allergies and medications reviewed and updated.    Current Outpatient Medications:    lisinopril (ZESTRIL) 20 MG tablet, Take 1 tablet by mouth once daily, Disp: 30 tablet, Rfl: 0    Review of Systems  Per HPI unless specifically indicated above     Objective:    BP 130/90    Pulse 81    Temp 98.2 F (36.8 C)    Ht 5\' 11"  (1.803 m)    Wt 186 lb 4.8 oz (84.5 kg)    SpO2 98%    BMI 25.98 kg/m   Wt Readings from Last 3 Encounters:  03/30/20 186 lb 4.8 oz (84.5 kg)  01/15/20 191 lb 12.8 oz (87 kg)  12/25/19 192 lb 12.8 oz (87.5 kg)    Physical Exam Vitals reviewed.  Constitutional:      Appearance: He is well-developed.  HENT:     Head: Normocephalic and atraumatic.  Cardiovascular:     Rate and Rhythm: Normal rate and regular rhythm.  Pulmonary:     Effort: Pulmonary effort is normal.     Breath sounds: Normal breath sounds. No wheezing.  Abdominal:     General: Bowel sounds are normal.     Palpations: Abdomen is soft.     Tenderness: There is no abdominal tenderness.  Musculoskeletal:     Cervical back: Neck supple.     Right lower leg: No edema.     Left lower leg: No edema.  Lymphadenopathy:     Cervical: No cervical adenopathy.  Skin:     General: Skin is warm and dry.  Neurological:     Mental Status: He is alert and oriented to person, place, and time.  Psychiatric:        Behavior: Behavior normal.     Results for orders placed or performed during the hospital encounter of 03/27/20  Hemoglobin A1c  Result Value Ref Range   Hgb A1c MFr Bld 4.9 4.8 - 5.6 %   Mean Plasma Glucose 93.93 mg/dL  Basic metabolic panel  Result Value Ref Range   Sodium 135 135 - 145 mmol/L   Potassium 3.5 3.5 - 5.1 mmol/L   Chloride 101 98 - 111 mmol/L   CO2 24 22 - 32 mmol/L   Glucose, Bld 95 70 - 99 mg/dL   BUN 12 6 - 20 mg/dL   Creatinine, Ser 02/24/20 0.61 - 1.24 mg/dL   Calcium 8.8 (L) 8.9 - 10.3 mg/dL   GFR calc non Af Amer >60 >60 mL/min   GFR calc  Af Amer >60 >60 mL/min   Anion gap 10 5 - 15      Assessment & Plan:    Encounter Diagnoses  Name Primary?   Essential hypertension Yes   Tobacco use disorder      -Reviewed labs with pt -encouraged pt to Get 2nd dose of covid vaccination -pt to Continue current meds -pt to Continue with GI per their recommendations -encouraged smoking cessation -pt to follow up 3 months.  He is to contact office sooner prn

## 2020-04-29 ENCOUNTER — Other Ambulatory Visit: Payer: Self-pay

## 2020-04-29 ENCOUNTER — Other Ambulatory Visit (HOSPITAL_COMMUNITY)
Admission: RE | Admit: 2020-04-29 | Discharge: 2020-04-29 | Disposition: A | Discharge status: Home / Self Care | Payer: Self-pay | Source: Ambulatory Visit | Attending: Gastroenterology | Admitting: Gastroenterology

## 2020-04-29 ENCOUNTER — Telehealth: Payer: Self-pay | Admitting: Internal Medicine

## 2020-04-29 DIAGNOSIS — R945 Abnormal results of liver function studies: Secondary | ICD-10-CM | POA: Insufficient documentation

## 2020-04-29 DIAGNOSIS — R7989 Other specified abnormal findings of blood chemistry: Secondary | ICD-10-CM

## 2020-04-29 LAB — HEPATIC FUNCTION PANEL
ALT: 20 U/L (ref 0–44)
AST: 19 U/L (ref 15–41)
Albumin: 4.7 g/dL (ref 3.5–5.0)
Alkaline Phosphatase: 104 U/L (ref 38–126)
Bilirubin, Direct: 0.1 mg/dL (ref 0.0–0.2)
Indirect Bilirubin: 1.3 mg/dL — ABNORMAL HIGH (ref 0.3–0.9)
Total Bilirubin: 1.4 mg/dL — ABNORMAL HIGH (ref 0.3–1.2)
Total Protein: 7.8 g/dL (ref 6.5–8.1)

## 2020-04-29 NOTE — Telephone Encounter (Signed)
Pt has OV with [place] on Friday. He is flagged to have labs prior to OV. He doesn't have lab orders and said he had labs done with his PCP last month. Please call [phone]

## 2020-04-29 NOTE — Telephone Encounter (Signed)
Spoke with pt. Lab orders needed were faxed to [facility] lab per pts request.

## 2020-04-30 LAB — MITOCHONDRIAL ANTIBODIES: Mitochondrial M2 Ab, IgG: 38.5 Units — ABNORMAL HIGH (ref 0.0–20.0)

## 2020-05-01 ENCOUNTER — Other Ambulatory Visit: Payer: Self-pay | Admitting: *Deleted

## 2020-05-01 ENCOUNTER — Ambulatory Visit (INDEPENDENT_AMBULATORY_CARE_PROVIDER_SITE_OTHER): Payer: Self-pay | Admitting: Gastroenterology

## 2020-05-01 ENCOUNTER — Encounter: Payer: Self-pay | Admitting: Gastroenterology

## 2020-05-01 ENCOUNTER — Other Ambulatory Visit: Payer: Self-pay

## 2020-05-01 VITALS — BP 129/84 | HR 72 | Temp 96.9°F | Ht 71.0 in | Wt 188.6 lb

## 2020-05-01 DIAGNOSIS — R7989 Other specified abnormal findings of blood chemistry: Secondary | ICD-10-CM

## 2020-05-01 DIAGNOSIS — R945 Abnormal results of liver function studies: Secondary | ICD-10-CM

## 2020-05-01 DIAGNOSIS — K2100 Gastro-esophageal reflux disease with esophagitis, without bleeding: Secondary | ICD-10-CM

## 2020-05-01 DIAGNOSIS — K7010 Alcoholic hepatitis without ascites: Secondary | ICD-10-CM

## 2020-05-01 NOTE — Progress Notes (Signed)
Primary Care Physician: Jacquelin Hawking, PA-C  Primary Gastroenterologist:  Roetta Sessions, MD   Chief Complaint  Patient presents with  . Gastroesophageal Reflux    when drinks milk    HPI: Tom Herman is a 39 y.o. male here for follow-up.  Seen back in April 2021.  History of previous alcohol abuse, alcoholic hepatitis, chronic right upper quadrant pain, early satiety initially seen in 2019.  History of AMA positive twice.  Previously discussed positive AMA with Dr. Jena Gauss and [facility] liver care.  Recommendations were to continue to monitor LFTs, if they start to rise then may consider liver biopsy.  He had labs this week, LFTs normal except for total bilirubin slightly elevated at 1.4 but was mostly indirect bilirubin of 1.3.  AMA again came back positive at 38.5.  Overall feeling well.  He does continue to have some early satiety with meals.  Heartburn only happens when he drinks milk.  Typically just avoids it.  Denies abdominal pain.  As far as omeprazole he has been getting over-the-counter medication and taking for a week at a time.  He has been out of his prescription omeprazole through the free clinic because he needs update his patient assistance forms.  He denies dysphagia.  Bowel movements are regular.  No blood in the stool or melena.  He states he continues to avoid liquor.  He rarely has beer, he had 1 or 2 beers a couple of months ago.  Denies frequent use since 2019.  Patient states he is without a job again.  His company shutdown.  He continues to watch after his diet on a daily basis.  He previously had strokes and has a history of hepatitis C so he is requesting that we screen him again for hepatitis C with his next labs.   EGD January 2021, erosive reflux esophagitis.  Previous imaging: August 2019-right upper quadrant ultrasound with increased echotexture of the liver compatible with fatty liver, liver appear prominent in size with possible hepatomegaly. No  cholelithiasis or cholecystitis.  August 2019-CT abdomen and pelvis with contrast with prominent diffuse fatty infiltration of the liver. Gallbladder somewhat distended but no wall thickening or inflammatory process. No stones. Pancreas unremarkable. Enlarged prostate gland measuring 2.3 cm with calcifications. Possible cystitis.  December 2019-right upper quadrant ultrasound normal  November 2020, abdominal u/s: Prominent spleen.  Hepatic steatosis.   Current Outpatient Medications  Medication Sig Dispense Refill  . lisinopril (ZESTRIL) 20 MG tablet Take 1 tablet (20 mg total) by mouth daily. 30 tablet 3   No current facility-administered medications for this visit.    Allergies as of 05/01/2020  . (No Known Allergies)    ROS:  General: Negative for anorexia, weight loss, fever, chills, fatigue, weakness. ENT: Negative for hoarseness, difficulty swallowing , nasal congestion. CV: Negative for chest pain, angina, palpitations, dyspnea on exertion, peripheral edema.  Respiratory: Negative for dyspnea at rest, dyspnea on exertion, cough, sputum, wheezing.  GI: See history of present illness. GU:  Negative for dysuria, hematuria, urinary incontinence, urinary frequency, nocturnal urination.  Endo: Negative for unusual weight change.    Physical Examination:   BP 129/84   Pulse 72   Temp (!) 96.9 F (36.1 C) (Temporal)   Ht 5\' 11"  (1.803 m)   Wt 188 lb 9.6 oz (85.5 kg)   BMI 26.30 kg/m   General: Well-nourished, well-developed in no acute distress.  Eyes: No icterus. Mouth: masked Abdomen: Bowel sounds are normal, nontender, nondistended,  no hepatosplenomegaly or masses, no abdominal bruits or hernia , no rebound or guarding.   Extremities: No lower extremity edema. No clubbing or deformities. Neuro: Alert and oriented x 4   Skin: Warm and dry, no jaundice.   Psych: Alert and cooperative, normal mood and affect.  Labs:  Lab Results  Component Value Date    CREATININE 0.87 03/27/2020   BUN 12 03/27/2020   NA 135 03/27/2020   K 3.5 03/27/2020   CL 101 03/27/2020   CO2 24 03/27/2020   Lab Results  Component Value Date   ALT 20 04/29/2020   AST 19 04/29/2020   ALKPHOS 104 04/29/2020   BILITOT 1.4 (H) 04/29/2020   Lab Results  Component Value Date   WBC 8.4 08/05/2019   HGB 16.1 08/05/2019   HCT 47.2 08/05/2019   MCV 92.4 08/05/2019   PLT 204 08/05/2019     Imaging Studies: No results found.   Impression/plan:  Pleasant 39 year old gentleman with history of alcohol abuse with alcoholic hepatitis in 2019.  Alcohol abuse currently in remission.  More recently his LFTs have been unremarkable.  2 days ago his LFTs were normal with exception of a total bilirubin of 1.4 which was mostly indirect bilirubin.  History of positive AMA on several occasions.  Multiple imaging studies as outlined above.  Noted fatty appearance of the liver and prominent spleen on the last 1 in November 2020.  At this time under the recommendations of Dr. Jena Gauss and [facility] liver care, we are monitoring his LFTs.  If his LFTs start to trend upward, he may require liver biopsy given his history of positive AMA.  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  Erosive reflux esophagitis on EGD in January.  Denies significant heartburn.  He does complain of early satiety which could be a variant of reflux or possibly related to prominent spleen.  He has taken PPI sporadically, encouraged him to take omeprazole 20 mg daily for 4 weeks to see if this helps his early satiety.  If it does not he should continue on a regular basis, otherwise he can take PPI as needed or any other over-the-counter antacids as needed.  Reinforced antireflux measures.  Return to the office in 6 months.  Call sooner if any questions or concerns.  I will touch base with the free clinic to see if there is any  possibilities of systems and obtaining  hepatitis A and B vaccinations.

## 2020-05-01 NOTE — Patient Instructions (Signed)
1. Continue to avoid all alcohol given your liver issues. You have done such a good job so far! 2. We will update your labs in six months, rechecking your liver labs and screening for hepatitis C.  3. Take omeprazole 20mg  daily for four weeks. If you sensation of getting full too quick gets better, then continue the medication. Otherwise you can take as needed for reflux. 4. Return to the office in six months for follow up. We will consider updating your ultrasound at that time.  5. Call if you have any questions or concerns.

## 2020-05-01 NOTE — Progress Notes (Signed)
CC'ED TO PCP 

## 2020-06-22 ENCOUNTER — Telehealth: Payer: Self-pay | Admitting: Physician Assistant

## 2020-06-22 ENCOUNTER — Other Ambulatory Visit: Payer: Self-pay

## 2020-06-22 DIAGNOSIS — Z20822 Contact with and (suspected) exposure to covid-19: Secondary | ICD-10-CM

## 2020-06-22 NOTE — Telephone Encounter (Signed)
Pt called wanting covid test due to exposure.  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   Tom Herman received only his first dose of covid vaccination.  He is not having any symptoms.  He is scheduled for covid test today.  He is counseled on self-isolation until his results.

## 2020-06-24 ENCOUNTER — Telehealth: Payer: Self-pay | Admitting: Student

## 2020-06-24 LAB — SARS-COV-2, NAA 2 DAY TAT

## 2020-06-24 LAB — NOVEL CORONAVIRUS, NAA: SARS-CoV-2, NAA: NOT DETECTED

## 2020-06-24 NOTE — Telephone Encounter (Signed)
LPN called and notified pt that his COVID test was negative. Pt verbalized understanding and all questions were answered.

## 2020-07-01 ENCOUNTER — Other Ambulatory Visit: Payer: Self-pay

## 2020-07-01 ENCOUNTER — Ambulatory Visit: Payer: Self-pay | Admitting: Physician Assistant

## 2020-07-01 ENCOUNTER — Encounter: Payer: Self-pay | Admitting: Physician Assistant

## 2020-07-01 VITALS — BP 143/91 | HR 82 | Temp 96.4°F

## 2020-07-01 DIAGNOSIS — F17200 Nicotine dependence, unspecified, uncomplicated: Secondary | ICD-10-CM

## 2020-07-01 DIAGNOSIS — K219 Gastro-esophageal reflux disease without esophagitis: Secondary | ICD-10-CM

## 2020-07-01 DIAGNOSIS — I10 Essential (primary) hypertension: Secondary | ICD-10-CM

## 2020-07-01 MED ORDER — LISINOPRIL 40 MG PO TABS: 40.0000 mg | ORAL_TABLET | Freq: Every day | ORAL | 3 refills | Status: DC

## 2020-07-01 NOTE — Progress Notes (Signed)
   BP (!) 143/91   Pulse 82   Temp (!) 96.4 F (35.8 C)   SpO2 94%    Subjective:    Patient ID: Tom Herman, male    DOB: 08-Sep-1981, 39 y.o.   MRN: [id number]  HPI: Tom Herman is a 39 y.o. male presenting on 07/01/2020 for No chief complaint on file.   HPI   Pt had a negative covid 19 screening questionnaire.     He was seen by GI in August and says they want him to take omeprazole but he hasn't been taking it.  He has medassist application with him today.  It is incomplete.   He got first covid vaccination dose but he has not scheduled the 2nd dose yet  He says he is doing well and he has no complaints.     Relevant past medical, surgical, family and social history reviewed and updated as indicated. Interim medical history since our last visit reviewed. Allergies and medications reviewed and updated.    Current Outpatient Medications:  .  lisinopril (ZESTRIL) 20 MG tablet, Take 1 tablet (20 mg total) by mouth daily., Disp: 30 tablet, Rfl: 3    Review of Systems  Per HPI unless specifically indicated above     Objective:    BP (!) 143/91   Pulse 82   Temp (!) 96.4 F (35.8 C)   SpO2 94%   Wt Readings from Last 3 Encounters:  05/01/20 188 lb 9.6 oz (85.5 kg)  03/30/20 186 lb 4.8 oz (84.5 kg)  01/15/20 191 lb 12.8 oz (87 kg)    Physical Exam Vitals reviewed.  Constitutional:      General: He is not in acute distress.    Appearance: He is well-developed. He is not ill-appearing.  HENT:     Head: Normocephalic and atraumatic.  Cardiovascular:     Rate and Rhythm: Normal rate and regular rhythm.  Pulmonary:     Effort: Pulmonary effort is normal.     Breath sounds: Normal breath sounds. No wheezing.  Abdominal:     General: Bowel sounds are normal.     Palpations: Abdomen is soft.     Tenderness: There is no abdominal tenderness.  Musculoskeletal:     Cervical back: Neck supple.     Right lower leg: No edema.     Left lower leg: No  edema.  Lymphadenopathy:     Cervical: No cervical adenopathy.  Skin:    General: Skin is warm and dry.  Neurological:     Mental Status: He is alert and oriented to person, place, and time.  Psychiatric:        Attention and Perception: Attention normal.        Speech: Speech normal.        Behavior: Behavior normal. Behavior is cooperative.           Assessment & Plan:    Encounter Diagnoses  Name Primary?  . Essential hypertension Yes  . Tobacco use disorder   . Gastroesophageal reflux disease, unspecified whether esophagitis present      -will increase lisinopril  -pt counseled on what he needs to complete the medassist application.  Will send rx omeprazole with it -pt encouraged to get 2nd dose of covid vaccination -pt to follow up 3 months.  He is to contact office sooner prn

## 2020-09-14 ENCOUNTER — Other Ambulatory Visit: Payer: Self-pay | Admitting: Physician Assistant

## 2020-09-14 DIAGNOSIS — Z13220 Encounter for screening for lipoid disorders: Secondary | ICD-10-CM

## 2020-09-14 DIAGNOSIS — I10 Essential (primary) hypertension: Secondary | ICD-10-CM

## 2020-09-30 ENCOUNTER — Ambulatory Visit: Payer: Self-pay | Admitting: Physician Assistant

## 2020-10-01 ENCOUNTER — Encounter: Payer: Self-pay | Admitting: Physician Assistant

## 2020-10-06 ENCOUNTER — Other Ambulatory Visit: Payer: Self-pay

## 2020-10-06 ENCOUNTER — Other Ambulatory Visit (HOSPITAL_COMMUNITY)
Admission: RE | Admit: 2020-10-06 | Discharge: 2020-10-06 | Disposition: A | Discharge status: Home / Self Care | Payer: Self-pay | Source: Ambulatory Visit | Attending: Physician Assistant | Admitting: Physician Assistant

## 2020-10-06 DIAGNOSIS — I10 Essential (primary) hypertension: Secondary | ICD-10-CM | POA: Insufficient documentation

## 2020-10-06 DIAGNOSIS — Z13220 Encounter for screening for lipoid disorders: Secondary | ICD-10-CM | POA: Insufficient documentation

## 2020-10-06 LAB — BASIC METABOLIC PANEL
Anion gap: 7 (ref 5–15)
BUN: 12 mg/dL (ref 6–20)
CO2: 26 mmol/L (ref 22–32)
Calcium: 9 mg/dL (ref 8.9–10.3)
Chloride: 102 mmol/L (ref 98–111)
Creatinine, Ser: 0.82 mg/dL (ref 0.61–1.24)
GFR, Estimated: >60 mL/min (ref >60)
Glucose, Bld: 99 mg/dL (ref 70–99)
Potassium: 4.4 mmol/L (ref 3.5–5.1)
Sodium: 135 mmol/L (ref 135–145)

## 2020-10-06 LAB — LIPID PANEL
Cholesterol: 166 mg/dL (ref 0–200)
HDL: 39 mg/dL — ABNORMAL LOW (ref >40)
LDL Cholesterol: 84 mg/dL (ref 0–99)
Total CHOL/HDL Ratio: 4.3 RATIO
Triglycerides: 216 mg/dL — ABNORMAL HIGH (ref <150)
VLDL: 43 mg/dL — ABNORMAL HIGH (ref 0–40)

## 2020-10-07 ENCOUNTER — Other Ambulatory Visit: Payer: Self-pay

## 2020-10-07 ENCOUNTER — Encounter: Payer: Self-pay | Admitting: Physician Assistant

## 2020-10-07 ENCOUNTER — Ambulatory Visit: Payer: Self-pay | Admitting: Physician Assistant

## 2020-10-07 VITALS — BP 116/80 | HR 104 | Temp 97.3°F | Ht 71.0 in | Wt 185.0 lb

## 2020-10-07 DIAGNOSIS — F17200 Nicotine dependence, unspecified, uncomplicated: Secondary | ICD-10-CM

## 2020-10-07 DIAGNOSIS — I10 Essential (primary) hypertension: Secondary | ICD-10-CM

## 2020-10-07 MED ORDER — ALBUTEROL SULFATE HFA 108 (90 BASE) MCG/ACT IN AERS: 2.0000 | INHALATION_SPRAY | Freq: Four times a day (QID) | RESPIRATORY_TRACT | 2 refills | Status: DC | PRN

## 2020-10-07 MED ORDER — LISINOPRIL 40 MG PO TABS: 40.0000 mg | ORAL_TABLET | Freq: Every day | ORAL | 4 refills | Status: DC

## 2020-10-07 NOTE — Progress Notes (Signed)
BP 116/80   Pulse (!) 104   Temp (!) 97.3 F (36.3 C)   Ht 5\' 11"  (1.803 m)   Wt 185 lb (83.9 kg)   SpO2 96%   BMI 25.80 kg/m    Subjective:    Patient ID: Tom Herman, male    DOB: 1981/06/18, 40 y.o.   MRN: [id number]  HPI: Tom Herman is a 40 y.o. male presenting on 10/07/2020 for Hypertension   HPI    Pt had a negative covid 19 screening questionnaire.      Chief Complaint  Patient presents with  . Hypertension    Pt says he is doing well.  He has Only gotten 1 dose of covid vaccine.  He Is working PT doing 10/09/2020.  He has no complaints today except for some occasional sob.  He continues to smoke.      Relevant past medical, surgical, family and social history reviewed and updated as indicated. Interim medical history since our last visit reviewed. Allergies and medications reviewed and updated.     Current Outpatient Medications:  .  lisinopril (ZESTRIL) 40 MG tablet, Take 1 tablet (40 mg total) by mouth daily., Disp: 30 tablet, Rfl: 3    Review of Systems  Per HPI unless specifically indicated above     Objective:    BP 116/80   Pulse (!) 104   Temp (!) 97.3 F (36.3 C)   Ht 5\' 11"  (1.803 m)   Wt 185 lb (83.9 kg)   SpO2 96%   BMI 25.80 kg/m   Wt Readings from Last 3 Encounters:  10/07/20 185 lb (83.9 kg)  05/01/20 188 lb 9.6 oz (85.5 kg)  03/30/20 186 lb 4.8 oz (84.5 kg)    Physical Exam Vitals reviewed.  Constitutional:      General: He is not in acute distress.    Appearance: He is well-developed and well-nourished. He is not toxic-appearing.  HENT:     Head: Normocephalic and atraumatic.  Cardiovascular:     Rate and Rhythm: Normal rate and regular rhythm.  Pulmonary:     Effort: Pulmonary effort is normal.     Breath sounds: Normal breath sounds. No wheezing.  Abdominal:     General: Bowel sounds are normal.     Palpations: Abdomen is soft. There is no hepatosplenomegaly.      Tenderness: There is no abdominal tenderness.  Musculoskeletal:        General: No edema.     Cervical back: Neck supple.     Right lower leg: No edema.     Left lower leg: No edema.  Lymphadenopathy:     Cervical: No cervical adenopathy.  Skin:    General: Skin is warm and dry.  Neurological:     Mental Status: He is alert and oriented to person, place, and time.  Psychiatric:        Mood and Affect: Mood and affect normal.        Behavior: Behavior normal.     Results for orders placed or performed during the hospital encounter of 10/06/20  Basic metabolic panel  Result Value Ref Range   Sodium 135 135 - 145 mmol/L   Potassium 4.4 3.5 - 5.1 mmol/L   Chloride 102 98 - 111 mmol/L   CO2 26 22 - 32 mmol/L   Glucose, Bld 99 70 - 99 mg/dL   BUN 12 6 - 20 mg/dL   Creatinine, Ser 05/31/20 0.61 - 1.24  mg/dL   Calcium 9.0 8.9 - 16.8 mg/dL   GFR, Estimated >37 >29 mL/min   Anion gap 7 5 - 15  Lipid panel  Result Value Ref Range   Cholesterol 166 0 - 200 mg/dL   Triglycerides 021 (H) <150 mg/dL   HDL 39 (L) >11 mg/dL   Total CHOL/HDL Ratio 4.3 RATIO   VLDL 43 (H) 0 - 40 mg/dL   LDL Cholesterol 84 0 - 99 mg/dL      Assessment & Plan:    Encounter Diagnoses  Name Primary?  . Essential hypertension Yes  . Tobacco use disorder      -reviewed labs with pt -pt is given albuterol Inhaler.  He is encouraged to stop smoking. -pt to continue his lisinopril for blood pressure -pt is encouraged to get his second covid vaccination dose -pt to follow up 4 months.  He is to contact office sooner prn

## 2020-10-08 ENCOUNTER — Other Ambulatory Visit: Payer: Self-pay

## 2020-10-08 DIAGNOSIS — R945 Abnormal results of liver function studies: Secondary | ICD-10-CM

## 2020-10-08 DIAGNOSIS — R7989 Other specified abnormal findings of blood chemistry: Secondary | ICD-10-CM

## 2020-10-08 DIAGNOSIS — K7010 Alcoholic hepatitis without ascites: Secondary | ICD-10-CM

## 2020-10-08 NOTE — Progress Notes (Signed)
Forms mailed to pt.

## 2020-11-02 ENCOUNTER — Ambulatory Visit: Payer: Self-pay | Admitting: Gastroenterology

## 2020-12-16 ENCOUNTER — Ambulatory Visit: Payer: Self-pay | Admitting: Gastroenterology

## 2020-12-16 ENCOUNTER — Other Ambulatory Visit: Payer: Self-pay | Admitting: Physician Assistant

## 2021-01-22 ENCOUNTER — Ambulatory Visit: Payer: Self-pay | Admitting: Gastroenterology

## 2021-02-03 ENCOUNTER — Ambulatory Visit: Payer: Self-pay | Admitting: Physician Assistant

## 2021-02-04 ENCOUNTER — Encounter: Payer: Self-pay | Admitting: Physician Assistant

## 2022-09-19 DIAGNOSIS — Z419 Encounter for procedure for purposes other than remedying health state, unspecified: Secondary | ICD-10-CM | POA: Diagnosis not present

## 2022-10-20 DIAGNOSIS — Z419 Encounter for procedure for purposes other than remedying health state, unspecified: Secondary | ICD-10-CM | POA: Diagnosis not present

## 2022-11-18 DIAGNOSIS — Z419 Encounter for procedure for purposes other than remedying health state, unspecified: Secondary | ICD-10-CM | POA: Diagnosis not present

## 2022-12-19 DIAGNOSIS — Z419 Encounter for procedure for purposes other than remedying health state, unspecified: Secondary | ICD-10-CM | POA: Diagnosis not present

## 2023-01-18 DIAGNOSIS — Z419 Encounter for procedure for purposes other than remedying health state, unspecified: Secondary | ICD-10-CM | POA: Diagnosis not present

## 2023-02-18 DIAGNOSIS — Z419 Encounter for procedure for purposes other than remedying health state, unspecified: Secondary | ICD-10-CM | POA: Diagnosis not present

## 2023-03-20 DIAGNOSIS — Z419 Encounter for procedure for purposes other than remedying health state, unspecified: Secondary | ICD-10-CM | POA: Diagnosis not present

## 2023-04-20 DIAGNOSIS — Z419 Encounter for procedure for purposes other than remedying health state, unspecified: Secondary | ICD-10-CM | POA: Diagnosis not present

## 2023-05-21 DIAGNOSIS — Z419 Encounter for procedure for purposes other than remedying health state, unspecified: Secondary | ICD-10-CM | POA: Diagnosis not present

## 2023-06-20 DIAGNOSIS — Z419 Encounter for procedure for purposes other than remedying health state, unspecified: Secondary | ICD-10-CM | POA: Diagnosis not present

## 2023-07-21 DIAGNOSIS — Z419 Encounter for procedure for purposes other than remedying health state, unspecified: Secondary | ICD-10-CM | POA: Diagnosis not present

## 2023-07-31 ENCOUNTER — Telehealth: Payer: Self-pay | Admitting: Internal Medicine

## 2023-07-31 NOTE — Telephone Encounter (Signed)
Needs new patient approval

## 2023-08-02 NOTE — Telephone Encounter (Signed)
scheduled

## 2023-08-20 DIAGNOSIS — Z419 Encounter for procedure for purposes other than remedying health state, unspecified: Secondary | ICD-10-CM | POA: Diagnosis not present

## 2023-09-19 ENCOUNTER — Ambulatory Visit (INDEPENDENT_AMBULATORY_CARE_PROVIDER_SITE_OTHER): Payer: Medicaid Other | Admitting: Internal Medicine

## 2023-09-19 ENCOUNTER — Encounter: Payer: Self-pay | Admitting: Internal Medicine

## 2023-09-19 VITALS — BP 128/84 | HR 77 | Ht 71.0 in | Wt 139.0 lb

## 2023-09-19 DIAGNOSIS — Z1329 Encounter for screening for other suspected endocrine disorder: Secondary | ICD-10-CM | POA: Diagnosis not present

## 2023-09-19 DIAGNOSIS — Z13220 Encounter for screening for lipoid disorders: Secondary | ICD-10-CM

## 2023-09-19 DIAGNOSIS — Z1321 Encounter for screening for nutritional disorder: Secondary | ICD-10-CM | POA: Diagnosis not present

## 2023-09-19 DIAGNOSIS — Z720 Tobacco use: Secondary | ICD-10-CM

## 2023-09-19 DIAGNOSIS — Z87898 Personal history of other specified conditions: Secondary | ICD-10-CM | POA: Diagnosis not present

## 2023-09-19 DIAGNOSIS — I10 Essential (primary) hypertension: Secondary | ICD-10-CM

## 2023-09-19 DIAGNOSIS — Z131 Encounter for screening for diabetes mellitus: Secondary | ICD-10-CM

## 2023-09-19 DIAGNOSIS — K2100 Gastro-esophageal reflux disease with esophagitis, without bleeding: Secondary | ICD-10-CM

## 2023-09-19 DIAGNOSIS — Z8659 Personal history of other mental and behavioral disorders: Secondary | ICD-10-CM | POA: Diagnosis not present

## 2023-09-19 NOTE — Assessment & Plan Note (Signed)
Currently smokes 1 pack/day of cigarettes and has been smoking for 15 years.  He is precontemplative with regards to cessation at this time.
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
## 2023-09-19 NOTE — Patient Instructions (Signed)
 It was a pleasure to see you today.  Thank you for giving us  the opportunity to be involved in your care.  Below is a brief recap of your visit and next steps.  We will plan to see you again in 6 months.  Summary You have established care today Basic labs ordered Counseling referral placed Follow up in 6 months

## 2023-09-19 NOTE — Assessment & Plan Note (Signed)
 He reports a history of depression.  Previously prescribed Zoloft.  Not currently on any antidepressant treatment currently.  PHQ-9 score is mildly elevated today at 14.  Denies SI/HI and is interested in counseling services. -Referral for counseling placed today

## 2023-09-19 NOTE — Assessment & Plan Note (Signed)
BP today is 128/84.  Not currently on any antihypertensive therapy.  Previously prescribed lisinopril.  No indication to resume antihypertensive treatment today.

## 2023-09-19 NOTE — Assessment & Plan Note (Signed)
Complicated by alcoholic hepatitis.  He endorses sobriety for more than 2 years.  Basic labs ordered today.

## 2023-09-20 DIAGNOSIS — Z419 Encounter for procedure for purposes other than remedying health state, unspecified: Secondary | ICD-10-CM | POA: Diagnosis not present

## 2023-09-20 LAB — LIPID PANEL
Chol/HDL Ratio: 2.9 {ratio} (ref 0.0–5.0)
Cholesterol, Total: 131 mg/dL (ref 100–199)
HDL: 45 mg/dL (ref >39)
LDL Chol Calc (NIH): 75 mg/dL (ref 0–99)
Triglycerides: 50 mg/dL (ref 0–149)
VLDL Cholesterol Cal: 11 mg/dL (ref 5–40)

## 2023-09-20 LAB — CBC WITH DIFFERENTIAL/PLATELET
Basophils Absolute: 0.1 10*3/uL (ref 0.0–0.2)
Basos: 1 %
EOS (ABSOLUTE): 0.1 10*3/uL (ref 0.0–0.4)
Eos: 1 %
Hematocrit: 45.7 % (ref 37.5–51.0)
Hemoglobin: 15.6 g/dL (ref 13.0–17.7)
Immature Grans (Abs): 0 10*3/uL (ref 0.0–0.1)
Immature Granulocytes: 0 %
Lymphocytes Absolute: 2.3 10*3/uL (ref 0.7–3.1)
Lymphs: 24 %
MCH: 30.8 pg (ref 26.6–33.0)
MCHC: 34.1 g/dL (ref 31.5–35.7)
MCV: 90 fL (ref 79–97)
Monocytes Absolute: 0.7 10*3/uL (ref 0.1–0.9)
Monocytes: 8 %
Neutrophils Absolute: 6.5 10*3/uL (ref 1.4–7.0)
Neutrophils: 66 %
Platelets: 284 10*3/uL (ref 150–450)
RBC: 5.06 x10E6/uL (ref 4.14–5.80)
RDW: 12.1 % (ref 11.6–15.4)
WBC: 9.7 10*3/uL (ref 3.4–10.8)

## 2023-09-20 LAB — TSH+FREE T4
Free T4: 1.36 ng/dL (ref 0.82–1.77)
TSH: 0.892 u[IU]/mL (ref 0.450–4.500)

## 2023-09-20 LAB — B12 AND FOLATE PANEL
Folate: >20 ng/mL (ref >3.0)
Vitamin B-12: 718 pg/mL (ref 232–1245)

## 2023-09-20 LAB — CMP14+EGFR
ALT: 13 [IU]/L (ref 0–44)
AST: 17 [IU]/L (ref 0–40)
Albumin: 4.9 g/dL (ref 4.1–5.1)
Alkaline Phosphatase: 110 [IU]/L (ref 44–121)
BUN/Creatinine Ratio: 12 (ref 9–20)
BUN: 10 mg/dL (ref 6–24)
Bilirubin Total: 0.7 mg/dL (ref 0.0–1.2)
CO2: 24 mmol/L (ref 20–29)
Calcium: 10.3 mg/dL — ABNORMAL HIGH (ref 8.7–10.2)
Chloride: 99 mmol/L (ref 96–106)
Creatinine, Ser: 0.81 mg/dL (ref 0.76–1.27)
Globulin, Total: 2.9 g/dL (ref 1.5–4.5)
Glucose: 83 mg/dL (ref 70–99)
Potassium: 4.5 mmol/L (ref 3.5–5.2)
Sodium: 139 mmol/L (ref 134–144)
Total Protein: 7.8 g/dL (ref 6.0–8.5)
eGFR: 114 mL/min/{1.73_m2} (ref >59)

## 2023-09-20 LAB — HEMOGLOBIN A1C
Est. average glucose Bld gHb Est-mCnc: 100 mg/dL
Hgb A1c MFr Bld: 5.1 % (ref 4.8–5.6)

## 2023-09-20 LAB — VITAMIN D 25 HYDROXY (VIT D DEFICIENCY, FRACTURES): Vit D, 25-Hydroxy: 38.4 ng/mL (ref 30.0–100.0)

## 2023-10-21 DIAGNOSIS — Z419 Encounter for procedure for purposes other than remedying health state, unspecified: Secondary | ICD-10-CM | POA: Diagnosis not present

## 2023-11-18 DIAGNOSIS — Z419 Encounter for procedure for purposes other than remedying health state, unspecified: Secondary | ICD-10-CM | POA: Diagnosis not present

## 2023-12-20 ENCOUNTER — Encounter (HOSPITAL_COMMUNITY): Payer: Self-pay

## 2023-12-20 ENCOUNTER — Ambulatory Visit (INDEPENDENT_AMBULATORY_CARE_PROVIDER_SITE_OTHER): Payer: Medicaid Other | Admitting: Clinical

## 2023-12-20 DIAGNOSIS — F419 Anxiety disorder, unspecified: Secondary | ICD-10-CM | POA: Diagnosis not present

## 2023-12-20 DIAGNOSIS — F331 Major depressive disorder, recurrent, moderate: Secondary | ICD-10-CM | POA: Diagnosis not present
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
## 2023-12-30 DIAGNOSIS — Z419 Encounter for procedure for purposes other than remedying health state, unspecified: Secondary | ICD-10-CM | POA: Diagnosis not present

## 2024-01-09 ENCOUNTER — Ambulatory Visit (INDEPENDENT_AMBULATORY_CARE_PROVIDER_SITE_OTHER): Admitting: Clinical

## 2024-01-09 DIAGNOSIS — F331 Major depressive disorder, recurrent, moderate: Secondary | ICD-10-CM | POA: Diagnosis not present

## 2024-01-09 DIAGNOSIS — F419 Anxiety disorder, unspecified: Secondary | ICD-10-CM | POA: Diagnosis not present

## 2024-01-09 NOTE — Progress Notes (Signed)
 Virtual Visit via Video Note  I connected with Tom Herman on 01/09/24 at 10:00 AM EDT by a video enabled telemedicine application and verified that I am speaking with the correct person using two identifiers.  Location: Patient: home Provider: office   I discussed the limitations of evaluation and management by telemedicine and the availability of in person appointments. The patient expressed understanding and agreed to proceed.    THERAPIST PROGRESS NOTE     Session Time: 10:00 AM-10:30 AM   Participation Level: Active   Behavioral Response: Casual and Alert,Depressed   Type of Therapy: Individual Therapy   Treatment Goals addressed: Mood and Anxiety   Interventions: CBT   Summary: Tom Herman is a 43 y.o. male who presents with depression and anxiety . The OPT therapist worked with the patient for his OPT treatment. The OPT therapist utilized Motivational Interviewing to assist in creating therapeutic repore. The patient in the session was engaged and work in collaboration giving feedback about his triggers and symptoms over the past few weeks.The patient spoke about working to stay out of trouble eliminating his relationship with other people who are not healthy for him. Additionally the patient spoke staying with his father and doing a lot of caregiving for his Father. 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 The OPT therapist utilized Cognitive Behavioral Therapy through cognitive restructuring as well as worked with the patient on coping strategies to assist in management of symptoms as well as reviewed sleep, eating habits, and general health. The patient spoke about the realization of needing a change and his ambition of improving his life and not settling. The patient spoke about interactions with his family and neighbors. 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 The OPT therapist overviewed with the patient leisure/coping options for Spring.The OPT therapist overviewed upcoming health appointments as listed in the patients MyChart.   Suicidal/Homicidal: Nowithout intent/plan   Therapist Response:The OPT therapist worked with the patient for the patients scheduled session. The patient was engaged in his session and gave feedback in relation to triggers, symptoms, and behavior responses over the past few weeks.The patient spoke about his interactions with family and neighbors. The patient identified working to keep himself out of trouble by staying busy and being around his Father and eliminating his interactions with people who are not positive influences. The OPT therapist worked with the patient utilizing an in session Cognitive Behavioral Therapy exercise. The patient was responsive in the session and verbalized, "I think people around here in [place] just settle and they don't want anything better for their life and I have more ambition and I do not want to just settle I want to be able to make more money and have a better life out of [facility] ".The OPT therapist worked with the patient providing ongoing psychotherapy/education and coping skills review. The patient spoke about at home projects including repairing the roof of the home and working on automobiles. The OPT therapist will continue treatment work with the patient in his next scheduled session   Plan: Return again in 2/3 weeks.   Diagnosis:      Axis I:  Recurrent moderate MDD with Anxiety   Axis II: No diagnosis      Collaboration of Care: No additional collaboration of care for this session.   Patient/Guardian was advised Release of Information must be obtained prior to  any record release in order to collaborate their care with an outside provider. Patient/Guardian was advised if they have not already done so to contact the registration department to sign all necessary forms  in order for us  to release information regarding their care.    Consent: Patient/Guardian gives verbal consent for treatment and assignment of benefits for services provided during this visit. Patient/Guardian expressed understanding and agreed to proceed.    I discussed the assessment and treatment plan with the patient. The patient was provided an opportunity to ask questions and all were answered. The patient agreed with the plan and demonstrated an understanding of the instructions.   The patient was advised to call back or seek an in-person evaluation if the symptoms worsen or if the condition fails to improve as anticipated.   I provided 30 minutes of non-face-to-face time during this encounter.   Lea Primmer, LCSW    01/09/2024

## 2024-01-29 DIAGNOSIS — Z419 Encounter for procedure for purposes other than remedying health state, unspecified: Secondary | ICD-10-CM | POA: Diagnosis not present

## 2024-02-08 ENCOUNTER — Ambulatory Visit (HOSPITAL_COMMUNITY): Admitting: Clinical

## 2024-02-29 DIAGNOSIS — Z419 Encounter for procedure for purposes other than remedying health state, unspecified: Secondary | ICD-10-CM | POA: Diagnosis not present

## 2024-03-06 ENCOUNTER — Other Ambulatory Visit: Payer: Self-pay

## 2024-03-12 ENCOUNTER — Ambulatory Visit (INDEPENDENT_AMBULATORY_CARE_PROVIDER_SITE_OTHER): Admitting: Clinical

## 2024-03-12 DIAGNOSIS — F331 Major depressive disorder, recurrent, moderate: Secondary | ICD-10-CM

## 2024-03-12 DIAGNOSIS — F419 Anxiety disorder, unspecified: Secondary | ICD-10-CM

## 2024-03-12 NOTE — Progress Notes (Signed)
 Virtual Visit via Video Note   I connected with Tom Herman on 03/12/24 at 11:00 AM EDT by a video enabled telemedicine application and verified that I am speaking with the correct person using two identifiers.   Location: Patient: home Provider: office   I discussed the limitations of evaluation and management by telemedicine and the availability of in person appointments. The patient expressed understanding and agreed to proceed.       THERAPIST PROGRESS NOTE     Session Time: 11:00 AM-11:30 AM   Participation Level: Active   Behavioral Response: Casual and Alert,Depressed   Type of Therapy: Individual Therapy   Treatment Goals addressed: Mood and Anxiety   Interventions: CBT   Summary: Tom Herman is a 43 y.o. male who presents with depression and anxiety . The OPT therapist worked with the patient for his OPT treatment. The OPT therapist utilized Motivational Interviewing to assist in creating therapeutic repore. The patient in the session was engaged and work in collaboration giving feedback about his triggers and symptoms over the past few weeks.The patient spoke about  ongoing working to stay out of trouble eliminating his relationship with other people who are not healthy for him. Additionally the patient spoke about ongoing staying with his father and doing a lot of caregiving for his Father. 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 The OPT therapist utilized Cognitive Behavioral Therapy through cognitive restructuring as well as worked with the patient on coping strategies to assist in management of symptoms as well as reviewed sleep, eating habits, and general health. The patient spoke about his  ongoing ambition of improving his life and not settling. The patient spoke about interactions with his family and neighbors. The OPT therapist overviewed with  the patient leisure/coping options for Summer. The patient spoke about a change with his PCP with his old provider leaving he has a appointment to meet with the new provider on 03/18/24. The patient spoke about plans with family for the upcoming 4th of July holiday. The OPT therapist overviewed upcoming health appointments as listed in the patients MyChart.   Suicidal/Homicidal: Nowithout intent/plan   Therapist Response:The OPT therapist worked with the patient for the patients scheduled session. The patient was engaged in his session and gave feedback in relation to triggers, symptoms, and behavior responses over the past few weeks.The patient spoke about his interactions with family and neighbors. The patient identified working to keep himself out of trouble by staying busy and being around his Father and eliminating his interactions with people who are not positive influences. The OPT therapist worked with the patient utilizing an in session Cognitive Behavioral Therapy exercise. The patient was responsive in the session and verbalized,   I am trying to just work on these cars and get them running and taking care of Dad I am about to go after this and get his lunch ready.The OPT therapist worked with the patient providing ongoing psychotherapy/education and coping skills review. The patient spoke about at home projects including repairing the roof of the home and working on automobiles and mowing to make some side money. The patient will be meeting with his new PCP 03/18/2024. The OPT therapist will continue treatment work with the patient in his next scheduled session   Plan: Return again in 2/3 weeks.   Diagnosis:      Axis I:  Recurrent  moderate MDD with Anxiety   Axis II: No diagnosis      Collaboration of Care: No additional collaboration of care for this session.   Patient/Guardian was advised Release of Information must be obtained prior to any record release in order to collaborate their  care with an outside provider. Patient/Guardian was advised if they have not already done so to contact the registration department to sign all necessary forms in order for us  to release information regarding their care.    Consent: Patient/Guardian gives verbal consent for treatment and assignment of benefits for services provided during this visit. Patient/Guardian expressed understanding and agreed to proceed.    I discussed the assessment and treatment plan with the patient. The patient was provided an opportunity to ask questions and all were answered. The patient agreed with the plan and demonstrated an understanding of the instructions.   The patient was advised to call back or seek an in-person evaluation if the symptoms worsen or if the condition fails to improve as anticipated.   I provided 30 minutes of non-face-to-face time during this encounter.   Tom ONEIDA Pepper, LCSW    03/12/2024

## 2024-03-18 ENCOUNTER — Ambulatory Visit

## 2024-03-18 ENCOUNTER — Ambulatory Visit: Payer: Medicaid Other | Admitting: Internal Medicine

## 2024-03-18 VITALS — BP 110/78 | HR 108 | Ht 71.0 in | Wt 140.0 lb

## 2024-03-18 DIAGNOSIS — F5101 Primary insomnia: Secondary | ICD-10-CM

## 2024-03-18 DIAGNOSIS — I10 Essential (primary) hypertension: Secondary | ICD-10-CM

## 2024-03-18 MED ORDER — TRAZODONE HCL 50 MG PO TABS: 25.0000 mg | ORAL_TABLET | Freq: Every evening | ORAL | 3 refills | Status: DC | PRN

## 2024-03-18 NOTE — Progress Notes (Unsigned)
 Established Patient Office Visit  Subjective   Patient ID: Tom Herman, male    DOB: September 16, 1981  Age: 43 y.o. MRN: [id number]  Chief Complaint  Patient presents with   Medical Management of Chronic Issues    6 month follow up     HPI  Patient Active Problem List   Diagnosis Date Noted   Primary insomnia 03/20/2024   Essential hypertension 09/19/2023   History of alcohol use disorder 09/19/2023   History of depression 09/19/2023   GERD (gastroesophageal reflux disease)    Early satiety    Abnormal LFTs 07/29/2019   RUQ pain 09/11/2018   Anemia 09/11/2018   Rectal bleeding    Protein-calorie malnutrition, severe 05/02/2018   Alcohol dependence with uncomplicated withdrawal (HCC) 05/02/2018   Tobacco abuse 05/02/2018   Bilirubinemia    Alcohol dependence (HCC) 04/30/2018   Alcoholic hepatitis 04/30/2018   Right testicular pain 04/30/2018      ROS    Objective:     BP 110/78 (BP Location: Left Arm, Patient Position: Sitting, Cuff Size: Normal)   Pulse (!) 108   Ht 5' 11 (1.803 m)   Wt 140 lb (63.5 kg)   SpO2 95%   BMI 19.53 kg/m  BP Readings from Last 3 Encounters:  03/18/24 110/78  09/19/23 128/84  10/07/20 116/80   Wt Readings from Last 3 Encounters:  03/18/24 140 lb (63.5 kg)  09/19/23 139 lb (63 kg)  10/07/20 185 lb (83.9 kg)      Physical Exam Vitals and nursing note reviewed.  Constitutional:      Appearance: Normal appearance.  HENT:     Head: Normocephalic.  Eyes:     Extraocular Movements: Extraocular movements intact.     Pupils: Pupils are equal, round, and reactive to light.  Cardiovascular:     Rate and Rhythm: Normal rate and regular rhythm.  Pulmonary:     Effort: Pulmonary effort is normal.     Breath sounds: Normal breath sounds.  Musculoskeletal:     Cervical back: Normal range of motion and neck supple.  Neurological:     Mental Status: He is alert and oriented to person, place, and time.  Psychiatric:         Mood and Affect: Mood normal.        Thought Content: Thought content normal.      Results for orders placed or performed in visit on 03/18/24  CMP14+EGFR  Result Value Ref Range   Glucose 95 70 - 99 mg/dL   BUN 13 6 - 24 mg/dL   Creatinine, Ser 9.10 0.76 - 1.27 mg/dL   eGFR 889 >40 fO/fpw/8.26   BUN/Creatinine Ratio 15 9 - 20   Sodium 139 134 - 144 mmol/L   Potassium 4.7 3.5 - 5.2 mmol/L   Chloride 99 96 - 106 mmol/L   CO2 20 20 - 29 mmol/L   Calcium 9.9 8.7 - 10.2 mg/dL   Total Protein 7.3 6.0 - 8.5 g/dL   Albumin 4.9 4.1 - 5.1 g/dL   Globulin, Total 2.4 1.5 - 4.5 g/dL   Bilirubin Total 0.4 0.0 - 1.2 mg/dL   Alkaline Phosphatase 90 44 - 121 IU/L   AST 14 0 - 40 IU/L   ALT 12 0 - 44 IU/L    Last CBC Lab Results  Component Value Date   WBC 9.7 09/19/2023   HGB 15.6 09/19/2023   HCT 45.7 09/19/2023   MCV 90 09/19/2023   MCH 30.8 09/19/2023  RDW 12.1 09/19/2023   PLT 284 09/19/2023   Last metabolic panel Lab Results  Component Value Date   GLUCOSE 95 03/18/2024   NA 139 03/18/2024   K 4.7 03/18/2024   CL 99 03/18/2024   CO2 20 03/18/2024   BUN 13 03/18/2024   CREATININE 0.89 03/18/2024   EGFR 110 03/18/2024   CALCIUM 9.9 03/18/2024   PROT 7.3 03/18/2024   ALBUMIN 4.9 03/18/2024   LABGLOB 2.4 03/18/2024   BILITOT 0.4 03/18/2024   ALKPHOS 90 03/18/2024   AST 14 03/18/2024   ALT 12 03/18/2024   ANIONGAP 7 10/06/2020   Last lipids Lab Results  Component Value Date   CHOL 131 09/19/2023   HDL 45 09/19/2023   LDLCALC 75 09/19/2023   TRIG 50 09/19/2023   CHOLHDL 2.9 09/19/2023   Last hemoglobin A1c Lab Results  Component Value Date   HGBA1C 5.1 09/19/2023      The 10-year ASCVD risk score (Arnett DK, et al., 2019) is: 1.6%    Assessment & Plan:   Problem List Items Addressed This Visit       Cardiovascular and Mediastinum   Essential hypertension   BP today is stable today.  Not currently on any antihypertensive therapy.  Previously  prescribed lisinopril .  No indication to resume antihypertensive treatment today.      Relevant Orders   CMP14+EGFR (Completed)     Other   Primary insomnia - Primary   Will add trazodone for ongoing insomnia.      Relevant Medications   traZODone (DESYREL) 50 MG tablet   Other Visit Diagnoses       Hypercalcemia       Elevated on most recent labs.  will recheck labs today.   Relevant Orders   CMP14+EGFR (Completed)       Return in about 6 months (around 09/17/2024) for chronic follow-up with PCP.    Leita Longs, FNP

## 2024-03-19 LAB — CMP14+EGFR
ALT: 12 IU/L (ref 0–44)
AST: 14 IU/L (ref 0–40)
Albumin: 4.9 g/dL (ref 4.1–5.1)
Alkaline Phosphatase: 90 IU/L (ref 44–121)
BUN/Creatinine Ratio: 15 (ref 9–20)
BUN: 13 mg/dL (ref 6–24)
Bilirubin Total: 0.4 mg/dL (ref 0.0–1.2)
CO2: 20 mmol/L (ref 20–29)
Calcium: 9.9 mg/dL (ref 8.7–10.2)
Chloride: 99 mmol/L (ref 96–106)
Creatinine, Ser: 0.89 mg/dL (ref 0.76–1.27)
Globulin, Total: 2.4 g/dL (ref 1.5–4.5)
Glucose: 95 mg/dL (ref 70–99)
Potassium: 4.7 mmol/L (ref 3.5–5.2)
Sodium: 139 mmol/L (ref 134–144)
Total Protein: 7.3 g/dL (ref 6.0–8.5)
eGFR: 110 mL/min/{1.73_m2} (ref >59)

## 2024-03-20 DIAGNOSIS — F5101 Primary insomnia: Secondary | ICD-10-CM | POA: Insufficient documentation

## 2024-03-20 NOTE — Assessment & Plan Note (Signed)
 BP today is stable today.  Not currently on any antihypertensive therapy.  Previously prescribed lisinopril .  No indication to resume antihypertensive treatment today.

## 2024-03-20 NOTE — Assessment & Plan Note (Signed)
 Will add trazodone for ongoing insomnia.

## 2024-03-30 DIAGNOSIS — Z419 Encounter for procedure for purposes other than remedying health state, unspecified: Secondary | ICD-10-CM | POA: Diagnosis not present

## 2024-04-09 ENCOUNTER — Ambulatory Visit (HOSPITAL_COMMUNITY): Admitting: Clinical

## 2024-04-09 ENCOUNTER — Encounter (HOSPITAL_COMMUNITY): Payer: Self-pay

## 2024-04-14 ENCOUNTER — Ambulatory Visit: Payer: Self-pay

## 2024-04-30 DIAGNOSIS — Z419 Encounter for procedure for purposes other than remedying health state, unspecified: Secondary | ICD-10-CM | POA: Diagnosis not present

## 2024-05-31 DIAGNOSIS — Z419 Encounter for procedure for purposes other than remedying health state, unspecified: Secondary | ICD-10-CM | POA: Diagnosis not present

## 2024-07-31 DIAGNOSIS — Z419 Encounter for procedure for purposes other than remedying health state, unspecified: Secondary | ICD-10-CM | POA: Diagnosis not present

## 2024-09-17 ENCOUNTER — Ambulatory Visit

## 2024-09-17 VITALS — BP 114/70 | HR 101 | Ht 71.0 in | Wt 146.0 lb

## 2024-09-17 DIAGNOSIS — I10 Essential (primary) hypertension: Secondary | ICD-10-CM | POA: Diagnosis not present

## 2024-09-17 MED ORDER — AMOXICILLIN 875 MG PO TABS: 875.0000 mg | ORAL_TABLET | Freq: Two times a day (BID) | ORAL | 0 refills | Status: AC

## 2024-09-17 NOTE — Progress Notes (Unsigned)
 "  Established Patient Office Visit  Subjective   Patient ID: Tom Herman, male    DOB: [birth year]  Age: 43 y.o. MRN: [id number]  Chief Complaint  Patient presents with   Medical Management of Chronic Issues    6 month follow up     HPI Discussed the use of AI scribe software for clinical note transcription with the patient, who gave verbal consent to proceed.  History of Present Illness    Tom Herman is a 42 year old male who presents for a six-month follow-up visit.  Hypertension management - No current use of antihypertensive medication. - Previously treated with lisinopril , now discontinued. - No significant issues or concerns related to blood pressure.  Sleep disturbance - Previously used trazodone  for sleep, discontinued due to grogginess. - Currently uses over-the-counter sleep aids from [employer]. - Over-the-counter sleep aids do not cause grogginess but are sometimes ineffective in promoting sleep.  Dental health - No current dental infection. - Requests antibiotics for potential future dental infection or illness. - Wisdom teeth removed last year after experiencing issues approximately fifteen years ago.      Patient Active Problem List   Diagnosis Date Noted   Primary insomnia 03/20/2024   Essential hypertension 09/19/2023   History of alcohol use disorder 09/19/2023   History of depression 09/19/2023   GERD (gastroesophageal reflux disease)    Early satiety    Abnormal LFTs 07/29/2019   RUQ pain 09/11/2018   Anemia 09/11/2018   Rectal bleeding    Protein-calorie malnutrition, severe 05/02/2018   Alcohol dependence with uncomplicated withdrawal (HCC) 05/02/2018   Tobacco abuse 05/02/2018   Bilirubinemia    Alcohol dependence (HCC) 04/30/2018   Alcoholic hepatitis (HCC) 04/30/2018   Right testicular pain 04/30/2018    ROS    Objective:     BP 114/70 (BP Location: Left Arm, Patient Position: Sitting, Cuff Size: Normal)   Pulse (!) 101    Ht 5' 11 (1.803 m)   Wt 146 lb 0.6 oz (66.2 kg)   SpO2 96%   BMI 20.37 kg/m  BP Readings from Last 3 Encounters:  09/17/24 114/70  03/18/24 110/78  09/19/23 128/84   Wt Readings from Last 3 Encounters:  09/17/24 146 lb 0.6 oz (66.2 kg)  03/18/24 140 lb (63.5 kg)  09/19/23 139 lb (63 kg)     Physical Exam Vitals and nursing note reviewed.  Constitutional:      Appearance: Normal appearance.  HENT:     Head: Normocephalic.  Eyes:     Extraocular Movements: Extraocular movements intact.     Pupils: Pupils are equal, round, and reactive to light.  Cardiovascular:     Rate and Rhythm: Normal rate and regular rhythm.  Pulmonary:     Effort: Pulmonary effort is normal.     Breath sounds: Normal breath sounds.  Musculoskeletal:     Cervical back: Normal range of motion and neck supple.  Neurological:     Mental Status: He is alert and oriented to person, place, and time.  Psychiatric:        Mood and Affect: Mood normal.        Thought Content: Thought content normal.      No results found for any visits on 09/17/24.    The 10-year ASCVD risk score (Arnett DK, et al., 2019) is: 1.9%    Assessment & Plan:   Problem List Items Addressed This Visit       Cardiovascular and Mediastinum  Essential hypertension - Primary   Blood pressure improved to 114 mmHg upon recheck. Not on antihypertensive medication.       Return in about 6 months (around 03/18/2025) for for yearly physical.    Leita Longs, FNP  "

## 2024-09-20 NOTE — Assessment & Plan Note (Signed)
 Blood pressure improved to 114 mmHg upon recheck. Not on antihypertensive medication.
